# Patient Record
Sex: Female | Born: 1970 | Marital: Married | Smoking: Never smoker
Health system: Southern US, Community
[De-identification: ages and names within clinical notes are randomized; demographics above are authoritative.]

## PROBLEM LIST (undated history)

## (undated) DIAGNOSIS — F419 Anxiety disorder, unspecified: Secondary | ICD-10-CM

## (undated) HISTORY — PX: TOTAL ABDOMINAL HYSTERECTOMY: SHX209

## (undated) HISTORY — DX: Anxiety disorder, unspecified: F41.9

---

## 2003-09-26 HISTORY — PX: TUBAL LIGATION: SHX77

## 2011-01-03 ENCOUNTER — Ambulatory Visit (INDEPENDENT_AMBULATORY_CARE_PROVIDER_SITE_OTHER): Payer: Commercial Indemnity | Admitting: Family Medicine

## 2011-01-03 ENCOUNTER — Encounter: Payer: Self-pay | Admitting: Family Medicine

## 2011-01-03 DIAGNOSIS — Z13220 Encounter for screening for lipoid disorders: Secondary | ICD-10-CM

## 2011-01-03 DIAGNOSIS — F418 Other specified anxiety disorders: Secondary | ICD-10-CM | POA: Insufficient documentation

## 2011-01-03 DIAGNOSIS — N809 Endometriosis, unspecified: Secondary | ICD-10-CM | POA: Insufficient documentation

## 2011-01-03 DIAGNOSIS — F341 Dysthymic disorder: Secondary | ICD-10-CM

## 2011-01-03 LAB — CBC
HCT: 41.9 % (ref 36.0–46.0)
MCV: 88.2 fL (ref 78.0–100.0)
Platelets: 273 10*3/uL (ref 150–400)
RBC: 4.75 MIL/uL (ref 3.87–5.11)
WBC: 9.2 10*3/uL (ref 4.0–10.5)

## 2011-01-03 MED ORDER — LITHIUM CARBONATE 150 MG PO CAPS: ORAL_CAPSULE | ORAL | Status: DC

## 2011-01-03 NOTE — Progress Notes (Signed)
  Subjective:    Patient ID: Kristi Lawrence, female    DOB: Nov 13, 1970, 40 y.o.   MRN: [id number]  HPI Says has felt ancxious and depressed over the winter. Having a lot of marital problems. Started the depakote and the citalopram at the same time.  Recently treated with the yeast infection.  Only took the citalopram for 3 days and says she felt so depressed she couldn't get out of bed.  She used to take natural lithium that was helping  Husband says she has been really irritable.  She feels lonely.  Says she wants to continue her 5-HTP.  She fees frustrated. She is a Technical sales engineer.  Has  attempted suicide once. Says tried to swallow a whole handful of castor beans to try to hurt herself about a week ago. Seh says has suicidal thoughts but not actively suicidal right now. She says she is very is because her brother has tried several different times and has not done well with these.  She wouldl ike to try lithium.     Review of Systems  Constitutional: Negative for fever, diaphoresis and unexpected weight change.  HENT: Negative for hearing loss, sneezing, postnasal drip and tinnitus.   Eyes: Positive for visual disturbance.  Respiratory: Negative for cough and wheezing.   Cardiovascular: Positive for chest pain. Negative for palpitations.  Genitourinary: Negative for vaginal bleeding and vaginal discharge.  Musculoskeletal: Negative for arthralgias.  Neurological: Positive for headaches.  Hematological: Negative for adenopathy. Does not bruise/bleed easily.  Psychiatric/Behavioral: Positive for sleep disturbance. The patient is nervous/anxious.     BP 122/78  Pulse 82  Ht 5\' 6"  (1.676 m)  Wt 167 lb (75.751 kg)  BMI 26.95 kg/m2    Allergies  Allergen Reactions  . Benadryl Allergy     Makes heart race     History reviewed. No pertinent past medical history.  Past Surgical History  Procedure Date  . Tubal ligation 2005  . Cesarean section 1998    toxemia    History   Social  History  . Marital Status: Married    Spouse Name: MCKAILA DUFFUS    Number of Children: 1  . Years of Education: assoc degr   Occupational History  . janitor    Social History Main Topics  . Smoking status: Never Smoker   . Smokeless tobacco: Not on file  . Alcohol Use: No  . Drug Use: No  . Sexually Active: No   Other Topics Concern  . Not on file   Social History Narrative   No regular exercise.  No caffeinePoor calcium intake.     Family History  Problem Relation Age of Onset  . Cancer    . Heart attack    . Depression Brother   . Alzheimer's disease    . Schizophrenia Maternal Grandmother       Objective:   Physical Exam  Constitutional: She appears well-developed and well-nourished.  HENT:  Head: Normocephalic.  Eyes: Pupils are equal, round, and reactive to light.  Neck: No thyromegaly present.  Cardiovascular: Normal rate, regular rhythm and normal heart sounds.   Pulmonary/Chest: Effort normal and breath sounds normal.  Musculoskeletal: She exhibits no edema.  Lymphadenopathy:    She has no cervical adenopathy.  Skin: Skin is warm and dry.  Psychiatric: She has a normal mood and affect. Her behavior is normal. Thought content normal.          Assessment & Plan:

## 2011-01-03 NOTE — Patient Instructions (Signed)
We will call you with the referral for counseling.  Follow up in about 1-2 weeks for mood.  Start the lithium as below.

## 2011-01-03 NOTE — Assessment & Plan Note (Signed)
She is not actively suicidal today and asked her to go to the ED if has suicidal thoughts again Will start lithium. F/U in 10 days.  I really do think she would benefit from counseling. She says she is doing this because her husband wants her to get counseling time 100% sure how motivated she is to really being open to this but I certainly think it would be extremely helpful. she says she will at least try it. Refer to behavioral health downstairs. Also check her TSH. She says she has not had labs in years. Certainly she could have a secondary source for her recent change in mood.

## 2011-01-04 ENCOUNTER — Telehealth: Payer: Self-pay | Admitting: Family Medicine

## 2011-01-04 LAB — LIPID PANEL
HDL: 40 mg/dL (ref >39)
LDL Cholesterol: 120 mg/dL — ABNORMAL HIGH (ref 0–99)
Total CHOL/HDL Ratio: 4.9 Ratio

## 2011-01-04 LAB — TSH: TSH: 2.664 u[IU]/mL (ref 0.350–4.500)

## 2011-01-04 LAB — COMPLETE METABOLIC PANEL WITH GFR
AST: 17 U/L (ref 0–37)
Alkaline Phosphatase: 76 U/L (ref 39–117)
BUN: 14 mg/dL (ref 6–23)
Creat: 0.72 mg/dL (ref 0.40–1.20)

## 2011-01-04 NOTE — Telephone Encounter (Signed)
Call pt: Labs are all normal except cholesterol LDL is 120 and the TG are high. Work on low fat diet and regular exercise to improve this. Recheck chol in one year.

## 2011-01-04 NOTE — Telephone Encounter (Signed)
Pt.notified

## 2011-01-10 ENCOUNTER — Other Ambulatory Visit: Payer: Self-pay | Admitting: Family Medicine

## 2011-01-10 ENCOUNTER — Ambulatory Visit (INDEPENDENT_AMBULATORY_CARE_PROVIDER_SITE_OTHER): Payer: Commercial Indemnity | Admitting: Family Medicine

## 2011-01-10 ENCOUNTER — Telehealth: Payer: Self-pay | Admitting: Family Medicine

## 2011-01-10 VITALS — BP 136/76 | HR 76 | Wt 168.0 lb

## 2011-01-10 DIAGNOSIS — F418 Other specified anxiety disorders: Secondary | ICD-10-CM

## 2011-01-10 DIAGNOSIS — N898 Other specified noninflammatory disorders of vagina: Secondary | ICD-10-CM

## 2011-01-10 DIAGNOSIS — F341 Dysthymic disorder: Secondary | ICD-10-CM

## 2011-01-10 DIAGNOSIS — L293 Anogenital pruritus, unspecified: Secondary | ICD-10-CM

## 2011-01-10 DIAGNOSIS — R351 Nocturia: Secondary | ICD-10-CM

## 2011-01-10 LAB — POCT URINALYSIS DIPSTICK
Nitrite, UA: NEGATIVE
Spec Grav, UA: 1.025
pH, UA: 5

## 2011-01-10 MED ORDER — METRONIDAZOLE 500 MG PO TABS: 500.0000 mg | ORAL_TABLET | Freq: Two times a day (BID) | ORAL | Status: AC

## 2011-01-10 MED ORDER — CIPROFLOXACIN HCL 500 MG PO TABS: 500.0000 mg | ORAL_TABLET | Freq: Two times a day (BID) | ORAL | Status: AC

## 2011-01-10 NOTE — Telephone Encounter (Signed)
Call pt: Wet prep is + for clue cells. Will send over ABX.

## 2011-01-10 NOTE — Assessment & Plan Note (Signed)
Overall she is actually doing much better on the lithium and she is tolerating it well without any side effects. She just went back to the twice a day dose approximately 3 days ago. I will see her back in one month at that point we can check a lithium level was well.  Her pH to 9 score was 19 today and she rates it as very difficult. On her mood questionnaire she screened positive for bipolar disorder

## 2011-01-10 NOTE — Progress Notes (Signed)
  Subjective:    Patient ID: Kristi Lawrence, female    DOB: 1970-12-01, 40 y.o.   MRN: [id number]  HPI Says she is less irritable and she has been crying less. Tolerating the lithium well.  No SE. Says her husband has been up and down in her moods. Says did get drunk a few nights ago because she was frustrated.  No change in bowels like she had with the other medication.  Not sleeping well.  Says has difficulty sleeping.  Got to bed as early as 9:30 and wakes up multiple times to urinate.  Has been this way for a long time.  Just went up to bid on the lithium 3 days ago.    She has been spotting and would like [place] to do a wet prep. Still having vaginal ithcing.    Review of Systems     Objective:   Physical Exam  Constitutional: She appears well-developed and well-nourished.  Cardiovascular: Normal rate, regular rhythm and normal heart sounds.   Pulmonary/Chest: Effort normal and breath sounds normal.          Assessment & Plan:  Vaginal itching -  Will send a wet prep. Also UA performed. UA with trace blood. Since also having nocturia will tx with cipro and will send a culture.    Nocturia-UA with trace blood. Since also having nocturia will tx with cipro and will send a culture.   This is clearly disrupting her quality of sleep. Regimens to improve her ability to fall asleep his first name is slightly really need to figure out why she is having such frequent urinations at night. She personally feels like it may be mental.

## 2011-01-11 NOTE — Telephone Encounter (Signed)
Pt.notified

## 2011-01-12 ENCOUNTER — Telehealth: Payer: Self-pay | Admitting: Family Medicine

## 2011-01-12 LAB — URINE CULTURE

## 2011-01-12 NOTE — Telephone Encounter (Signed)
Pt notified. Pt states she doesn't know how she is feeling

## 2011-01-12 NOTE — Telephone Encounter (Signed)
Urine culture is neg.  Is she feeling any better?

## 2011-01-17 ENCOUNTER — Other Ambulatory Visit: Payer: Self-pay | Admitting: Family Medicine

## 2011-01-17 ENCOUNTER — Ambulatory Visit (INDEPENDENT_AMBULATORY_CARE_PROVIDER_SITE_OTHER): Payer: Commercial Indemnity | Admitting: Behavioral Health

## 2011-01-17 DIAGNOSIS — F331 Major depressive disorder, recurrent, moderate: Secondary | ICD-10-CM

## 2011-01-26 ENCOUNTER — Encounter (INDEPENDENT_AMBULATORY_CARE_PROVIDER_SITE_OTHER): Payer: Commercial Indemnity | Admitting: Behavioral Health

## 2011-01-26 DIAGNOSIS — F331 Major depressive disorder, recurrent, moderate: Secondary | ICD-10-CM

## 2011-02-02 ENCOUNTER — Encounter (INDEPENDENT_AMBULATORY_CARE_PROVIDER_SITE_OTHER): Payer: Commercial Indemnity | Admitting: Behavioral Health

## 2011-02-02 DIAGNOSIS — F331 Major depressive disorder, recurrent, moderate: Secondary | ICD-10-CM

## 2011-02-07 ENCOUNTER — Ambulatory Visit (INDEPENDENT_AMBULATORY_CARE_PROVIDER_SITE_OTHER): Payer: Commercial Indemnity | Admitting: Family Medicine

## 2011-02-07 VITALS — BP 127/87 | HR 82 | Ht 66.0 in | Wt 170.0 lb

## 2011-02-07 DIAGNOSIS — N809 Endometriosis, unspecified: Secondary | ICD-10-CM

## 2011-02-07 DIAGNOSIS — F341 Dysthymic disorder: Secondary | ICD-10-CM

## 2011-02-07 DIAGNOSIS — F418 Other specified anxiety disorders: Secondary | ICD-10-CM

## 2011-02-07 DIAGNOSIS — Z5181 Encounter for therapeutic drug level monitoring: Secondary | ICD-10-CM

## 2011-02-07 MED ORDER — CELECOXIB 200 MG PO CAPS: 200.0000 mg | ORAL_CAPSULE | Freq: Two times a day (BID) | ORAL | Status: DC

## 2011-02-07 NOTE — Patient Instructions (Signed)
Can try the medication for your endometriosis.

## 2011-02-07 NOTE — Assessment & Plan Note (Signed)
I explained her that we do not usually use narcotics to treat this. I would prefer that she see GYN to discuss further treatment. In the meantime we can certainly try a different anti-inflammatory. I sent a prescription for Celebrex to the pharmacy for her to try. I did remind her that the 10 minute always clearly  hazardous to her health.

## 2011-02-07 NOTE — Assessment & Plan Note (Signed)
PHQ - 9 score of 12 today, GAD- 7 score of 8.   medication as well as counseling has been helping her tremendously. I am happy with the progress that she has made. I discussed whether or not she thought she might feel better on an increased dose of lithium but she said she would like to stay at her current dose. She is due to check her lithium level today so we will do that. Followup in 6 weeks.

## 2011-02-07 NOTE — Progress Notes (Signed)
  Subjective:    Patient ID: Kristi Lawrence, female    DOB: Mar 03, 1971, 40 y.o.   MRN: [id number]  HPI She feels the lithium is helping her overall. She really doesn't want to change her dose. Still really struggling with he relationship with her husband. She feels she is not supportive. She is seeing Mr Michelle Piper downstairs for counseling and feels this is helpful.  She feels her husband is her  Trigger. Seh denies any SE from the lithium. Says actually had her best day this week that she had had in a year, but says her husband came home and was mean to her and ruined it.    Wants to know what she can do for her endometriosis. She has to take about 64 midols yo control her pain and then still usually can't leave the house the first day for her period. Marland Kitchen  She was on Lupron at one point. Had tubal so whe wouldn't have to take a. OCPs so really doesn't want to take these.     Review of Systems     Objective:   Physical Exam  Constitutional: She appears well-developed and well-nourished.  Cardiovascular: Normal rate, regular rhythm and normal heart sounds.   Pulmonary/Chest: Effort normal and breath sounds normal.          Assessment & Plan:

## 2011-02-08 ENCOUNTER — Telehealth: Payer: Self-pay | Admitting: Family Medicine

## 2011-02-08 NOTE — Telephone Encounter (Signed)
Consultation: Her lithium level is normal. She is happy with her current dose we can leave it but we do not plan for him to increase her dose if she feels like he would be helpful. Otherwise please keep scheduled followup.

## 2011-02-08 NOTE — Telephone Encounter (Signed)
Pt notified of results and will leave dose the same

## 2011-02-16 ENCOUNTER — Encounter (INDEPENDENT_AMBULATORY_CARE_PROVIDER_SITE_OTHER): Payer: Commercial Indemnity | Admitting: Behavioral Health

## 2011-02-16 DIAGNOSIS — F332 Major depressive disorder, recurrent severe without psychotic features: Secondary | ICD-10-CM

## 2011-02-21 ENCOUNTER — Other Ambulatory Visit: Payer: Self-pay | Admitting: Obstetrics & Gynecology

## 2011-02-21 ENCOUNTER — Encounter (INDEPENDENT_AMBULATORY_CARE_PROVIDER_SITE_OTHER): Payer: Commercial Indemnity | Admitting: Obstetrics & Gynecology

## 2011-02-21 DIAGNOSIS — R5383 Other fatigue: Secondary | ICD-10-CM

## 2011-02-21 DIAGNOSIS — R102 Pelvic and perineal pain: Secondary | ICD-10-CM

## 2011-02-21 DIAGNOSIS — N949 Unspecified condition associated with female genital organs and menstrual cycle: Secondary | ICD-10-CM

## 2011-02-22 NOTE — Assessment & Plan Note (Signed)
Kristi Lawrence, Kristi Lawrence                 ACCOUNT NO.:  [id number]  MEDICAL RECORD NO.:  [id number]           PATIENT TYPE:  A  LOCATION:  [facility] [place]          FACILITY:  [facility]  PHYSICIAN:  [name], MD      DATE OF BIRTH:  11-08-1970  DATE OF SERVICE:  02/21/2011                                 CLINIC NOTE  The patient is a 40 year old G2, P1-0-1-1 female who presents for pelvic pain.  The patient has a long history of pelvic pain.  She had a C- section for severe toxemia, but no endometriosis was noted at that time. In 2004, she underwent an elective BTL due to the severe toxemia and "her doctor," Dr. Marja Kays from [facility], told her had endometriosis. The patient was not having pelvic pain at that time, but he placed her on 9 months of Lupron to control the proliferation of the endometriosis. The patient started having a lot of side effects of the Lupron, so she stopped.  After that, she states she started having pelvic pain.  The patient went to Dr. Jackquline Bosch approximately a year later, who diagnosed her with low progesterone and placed her on progesterone cream.  She then lost her insurance, I believe, and started using whole foods, naturopathic remedies.  She does take a lot of herbs in general.  The patient states the pain has been bad for 5 years.  She does not have sex due to marital problems, but when she did, it was only occasional, pain with sex.  She is undergoing counseling with Behavioral Health here. The patient states that the husband wanted more children and was upset that she got her tubes tied.  She does have irritable bowel syndrome and has lot of problems with constipation and she does not stay on her "herb."  She does have frequency of urination and urgency, we did do a urinalysis and urine culture.  She drinks at least 64 ounces of water a day which could do with the frequency.  A voiding diary might also be of use.  The patient is also complaining of  fatigue and weight gain and we will draw a TSH today.  The patient has never had transvaginal ultrasound that I can tell, we will order that today as well.  PAST MEDICAL HISTORY: 1. Arthritis. 2. Irritable bowel syndrome.  OBSTETRICAL HISTORY:  C-section x1 due to severe toxemia.  She has a vertical skin incision.  She also had a termination.  MENSTRUAL HISTORY:  The patient had menarche at age 41.  Her periods are regular, they last 6-7 days, are heavy with severe pain.  She has never been diagnosed with fibroid, tumors of the uterus, ovarian cysts, or sexually transmitted diseases.  She has been diagnosed with endometriosis.  PAST SURGICAL HISTORY:  Emergency C-section due to severe toxemia and then a tubal ligation and termination of pregnancy.  SOCIAL HISTORY:  She is employed.  She lives with her husband and daughter.  She does not smoke, drink, or do drugs.  She has never been sexually or physically abused.  FAMILY HISTORY:  Positive for either ovarian or endometrial cancer on her father's side, one in an aunt and  one in the grandmother, she does not know where it originated, but they know that it spread everywhere in her pelvis and this is what they got from.  We talked about BRCA testing and possibly Lynch testing.  She does not seem that she will be able to get information about these tests.  Systems review is positive for depression, anxiety, headaches, lightheadedness, muscle and joint pain, nausea, abdominal pain, and vaginal itching.  She recently was treated for bacterial vaginosis by Dr. Linford Arnold.  PHYSICAL EXAMINATION:  VITAL SIGNS:  Pulse 76, blood pressure 134/84, weight 179, height 65 inches. GENERAL:  Well nourished, well developed, no apparent distress. HEENT:  Normocephalic, atraumatic. CHEST:  Normal movement. ABDOMEN:  Soft, nontender.  No rebound, no guarding, no organomegaly, and no hernia.  No pain elicited on exam. PELVIC:  Genitalia Tanner V.   Vagina pink, normal rugae.  Cervix closed. Uterus is nontender.  Adnexa right and left, both ovaries are felt, no tenderness on either side.  She is mildly tender over her bladder, no tenderness over the rectum.  She has the most pain which I will consider moderate over her ischial spines and levator ani.  ASSESSMENT AND PLAN:  A 40 year old female with pelvic pain, lethargy, weight gain. 1. TSH. 2. The patient needs annual examination. 3. Transvaginal ultrasound. 4. Record release from Dr. Marja Kays and Berton Lan.          ______________________________ [name], MD    KL/MEDQ  D:  02/21/2011  T:  02/22/2011  Job:  [id number]

## 2011-02-23 ENCOUNTER — Ambulatory Visit
Admission: RE | Admit: 2011-02-23 | Discharge: 2011-02-23 | Disposition: A | Discharge status: Home / Self Care | Payer: Commercial Indemnity | Source: Ambulatory Visit | Attending: Obstetrics & Gynecology | Admitting: Obstetrics & Gynecology

## 2011-02-23 ENCOUNTER — Encounter (INDEPENDENT_AMBULATORY_CARE_PROVIDER_SITE_OTHER): Payer: Commercial Indemnity | Admitting: Behavioral Health

## 2011-02-23 ENCOUNTER — Encounter (HOSPITAL_COMMUNITY): Payer: Commercial Indemnity | Admitting: Behavioral Health

## 2011-02-23 DIAGNOSIS — F332 Major depressive disorder, recurrent severe without psychotic features: Secondary | ICD-10-CM

## 2011-02-23 DIAGNOSIS — R102 Pelvic and perineal pain: Secondary | ICD-10-CM

## 2011-03-02 ENCOUNTER — Encounter (INDEPENDENT_AMBULATORY_CARE_PROVIDER_SITE_OTHER): Payer: Commercial Indemnity | Admitting: Behavioral Health

## 2011-03-02 DIAGNOSIS — F332 Major depressive disorder, recurrent severe without psychotic features: Secondary | ICD-10-CM

## 2011-03-06 ENCOUNTER — Encounter (INDEPENDENT_AMBULATORY_CARE_PROVIDER_SITE_OTHER): Payer: Commercial Indemnity | Admitting: Behavioral Health

## 2011-03-06 DIAGNOSIS — F332 Major depressive disorder, recurrent severe without psychotic features: Secondary | ICD-10-CM

## 2011-03-14 ENCOUNTER — Ambulatory Visit: Payer: Commercial Indemnity | Admitting: Obstetrics & Gynecology

## 2011-03-14 ENCOUNTER — Other Ambulatory Visit: Payer: Self-pay | Admitting: Family Medicine

## 2011-03-14 ENCOUNTER — Ambulatory Visit (INDEPENDENT_AMBULATORY_CARE_PROVIDER_SITE_OTHER): Payer: Commercial Indemnity | Admitting: Obstetrics & Gynecology

## 2011-03-14 DIAGNOSIS — F32A Depression, unspecified: Secondary | ICD-10-CM

## 2011-03-14 DIAGNOSIS — N949 Unspecified condition associated with female genital organs and menstrual cycle: Secondary | ICD-10-CM

## 2011-03-14 DIAGNOSIS — R319 Hematuria, unspecified: Secondary | ICD-10-CM

## 2011-03-14 DIAGNOSIS — F329 Major depressive disorder, single episode, unspecified: Secondary | ICD-10-CM

## 2011-03-14 NOTE — Telephone Encounter (Signed)
Pt stopped by the office for med refill of lithium.  Due for 6 week followup .  Will give a 30 day supply of medication. Fay Records, LPN

## 2011-03-15 NOTE — Assessment & Plan Note (Signed)
Kristi Lawrence, HEACOX                 ACCOUNT NO.:  [id number]  MEDICAL RECORD NO.:  [id number]           PATIENT TYPE:  LOCATION:  [facility] [place]           FACILITY:  PHYSICIAN:  [name], MD      DATE OF BIRTH:  [birth year]  DATE OF SERVICE:                                 CLINIC NOTE  The patient is a 40 year old female presents for chronic pelvic pain. The patient had transvaginal ultrasound which showed normal uterus. Bilateral ovaries were normal with small physiological follicles present.  The patient had an urinalysis done which showed moderate blood.  She has been told she has had hematuria in the past.  I pulled an urinalysis from April 2012 also showed small blood.  The patient denies any urinary symptoms that are different for her, however she does have a lot of urinary frequency.  She goes about every hour.  The patient does not admit to losing urine, but she does have urgency.  I believe now that she has had hematuria on 2 specimens.  I think she needs an evaluation by urologist to see there is anything pathological causing the hematuria and also evaluation for interstitial cystitis because she does have pain above the bladder and urinary frequency, urgency.  The patient do have an operative note from 2002 that showed laser fulguration of endometriosis, however there was no pathological specimens.  There was some mild lysis of adhesions evolving the sigmoid colon and there was a large amount of adhesions involving the bladder and lower uterine segment, but these were not taken down during the operation from 2002.  We talked about natural remedies versus invasive remedies.  The patient has not been a fan of not going to doctors because she is convinced that after her laparoscopy and being placed on Lupron for 9 months, she thinks this caused her pelvic pain because she did not have pelvic pain before.  I did find in her operative note that she did have mild pelvic  pain prior to the operation and she does not remember telling the doctor this, but she said it is nothing like she has now.  We talked about physical therapy because she is tender over her levators and vaginal sidewalls which could be due to chronic muscle contraction and causing the pain cycle.  We also talked about an urological evaluation which she is also in favor off.  The other option would be Lupron for 3 months to see if this helps her pelvic pain.  If it did indeed help her pelvic pain, then proceeding with hysterectomy would be warranted.  However, the patient wants to hold off on this for right now.  She also does not want hysterectomy and then her ovaries taking out because this would obliviously put her in a medical menopause and this is not what she desires for herself.  We also talked about acupuncture and I am going to try to find out acupuncture clinician who focuses on womens health in the area.  The patient continues to take multiple herbs to help her with her bowel movements.  She says she has no problems with constipation as long as she is on herbs.  PAST  MEDICAL HISTORY:  Arthritis, irritable bowel syndrome.  OBSTETRICAL HISTORY:  C-section x1 one with a large vertical skin incision.  She had 1 termination.  GYNECOLOGIC HISTORY:  Operative note showing endometriosis but no pathology.  She has had tubal ligation.  She is occasionally sexually active.  However she has some relationship problems with her husband.  SOCIAL HISTORY:  No changes.  FAMILY HISTORY:  No changes.  MEDICATIONS:  No changes from 2 weeks ago.  PHYSICAL EXAMINATION:  VITAL SIGNS:  Pulse 87, blood pressure 136/80, weight 172, height 65 inches. GENERAL:  Well nourished, well developed, no apparent distress. HEENT:  Normocephalic, atraumatic. CHEST:  Normal movement. ABDOMEN:  Soft, nontender.  No rebound or guarding.  No organomegaly. No hernia. PELVIC:  Genitalia, Tanner V.  Vagina pink.   Normal rugae.  The patient is still tender over the side walls of the vagina, mild to moderate tenderness.  Uterus is now mildly tender.  Adnexa, no tenderness, no masses.  ASSESSMENT AND PLAN:  A 40 year old female with chronic pelvic pain. 1. Pelvic physical therapy. 2. Chronic hematuria.  Refer to urologist for evaluation of the     chronic hematuria as well as interstitial cystitis. 3. We will find acupuncturist in the area if physical therapy does not     work. 4. Motrin 600 mg every 6 hours or 800 mg every 8 hours at the first     onset of her menses.  This will help with pain and decreasing     menstrual flow. 5. The patient would also consider possible Lupron for 3 months if all     this fails, just see if this helps her pelvic pain. 6. Return to clinic in 2 months          ______________________________ [name], MD    KL/MEDQ  D:  03/14/2011  T:  03/15/2011  Job:  [id number]

## 2011-03-20 ENCOUNTER — Encounter (INDEPENDENT_AMBULATORY_CARE_PROVIDER_SITE_OTHER): Payer: Commercial Indemnity | Admitting: Behavioral Health

## 2011-03-20 DIAGNOSIS — F332 Major depressive disorder, recurrent severe without psychotic features: Secondary | ICD-10-CM

## 2011-03-27 ENCOUNTER — Ambulatory Visit: Payer: Commercial Indemnity | Attending: Obstetrics & Gynecology | Admitting: Physical Therapy

## 2011-03-27 DIAGNOSIS — M2420 Disorder of ligament, unspecified site: Secondary | ICD-10-CM | POA: Insufficient documentation

## 2011-03-27 DIAGNOSIS — IMO0001 Reserved for inherently not codable concepts without codable children: Secondary | ICD-10-CM | POA: Insufficient documentation

## 2011-03-27 DIAGNOSIS — M629 Disorder of muscle, unspecified: Secondary | ICD-10-CM | POA: Insufficient documentation

## 2011-03-28 ENCOUNTER — Encounter: Payer: Self-pay | Admitting: Family Medicine

## 2011-03-28 ENCOUNTER — Ambulatory Visit (INDEPENDENT_AMBULATORY_CARE_PROVIDER_SITE_OTHER): Payer: Commercial Indemnity | Admitting: Family Medicine

## 2011-03-28 VITALS — BP 131/91 | HR 78 | Ht 65.0 in | Wt 171.0 lb

## 2011-03-28 DIAGNOSIS — R102 Pelvic and perineal pain: Secondary | ICD-10-CM

## 2011-03-28 DIAGNOSIS — F341 Dysthymic disorder: Secondary | ICD-10-CM

## 2011-03-28 DIAGNOSIS — G4733 Obstructive sleep apnea (adult) (pediatric): Secondary | ICD-10-CM

## 2011-03-28 DIAGNOSIS — N949 Unspecified condition associated with female genital organs and menstrual cycle: Secondary | ICD-10-CM

## 2011-03-28 DIAGNOSIS — F418 Other specified anxiety disorders: Secondary | ICD-10-CM

## 2011-03-28 MED ORDER — NAPROXEN 375 MG PO TABS: 375.0000 mg | ORAL_TABLET | Freq: Two times a day (BID) | ORAL | Status: DC

## 2011-03-28 NOTE — Assessment & Plan Note (Addendum)
PHQ-9 socre of 11.  GAD-7 score of 8.  She is stable from last time. I really think she might benefit from increase on her dose but she wants to stay on the same dose.  I do want her to continue counseling as I do think it really helps her.

## 2011-03-28 NOTE — Assessment & Plan Note (Signed)
CPAP - Discussed seeing Dr. Gaetano Net for her sleep issues. I really want her to wear it but I really think she could benefit from a speacialist to help her be able to use her CPAP. Pt says want to wait on this for now as she is seeing so many specialists for other issues.

## 2011-03-28 NOTE — Progress Notes (Signed)
  Subjective:    Patient ID: Kristi Lawrence, female    DOB: 10-17-1970, 40 y.o.   MRN: [id number]  HPI She is doing some cousneling.  She is doing well overall. She feel really tired today.  She is taking her lihtium.  Does snore.  She does have sleep apnea.  Quit using her CPAP because has a a lot of difficulty  Wearing it because it ramps up.    She is really struggling with her relationship. She wants to continue her current dose on the Lithium.  Her Collene Gobble is he rbiggest trigger but she feels stuck int he relationbship because financially she can afford to leave him. She also has a child involved.    Tried the celebrex for her abdominal pain and does work but doesn't like how she feels.  She felt the celebrex caused her to feel tearful and more depressed.    Review of Systems     Objective:   Physical Exam  Constitutional: She is oriented to person, place, and time. She appears well-developed and well-nourished.  HENT:  Head: Normocephalic and atraumatic.  Cardiovascular: Normal rate, regular rhythm and normal heart sounds.   Pulmonary/Chest: Effort normal and breath sounds normal.  Neurological: She is alert and oriented to person, place, and time.  Skin: Skin is warm and dry.  Psychiatric: She has a normal mood and affect. Her behavior is normal.          Assessment & Plan:  CPAP - Discussed seeing Dr. Gaetano Net for her sleep issues. I really want her to wear it but I really think she could benefit from a speacialist to help her be able to use her CPAP. Pt says want to wait on this for now as she is seeing so many specialists for other issues.    Pelvic Pain - She is now seeing GYN. Wil change her celebrex to naproxen and see if doesn't seem to worsen her depression. This is a very unusual sxs of celebrex and I am not convined these are related.

## 2011-04-03 ENCOUNTER — Encounter (INDEPENDENT_AMBULATORY_CARE_PROVIDER_SITE_OTHER): Payer: Commercial Indemnity | Admitting: Behavioral Health

## 2011-04-03 DIAGNOSIS — F331 Major depressive disorder, recurrent, moderate: Secondary | ICD-10-CM

## 2011-04-05 ENCOUNTER — Encounter (HOSPITAL_COMMUNITY): Payer: Commercial Indemnity | Admitting: Behavioral Health

## 2011-04-05 ENCOUNTER — Ambulatory Visit: Payer: Commercial Indemnity | Admitting: Physical Therapy

## 2011-04-12 ENCOUNTER — Ambulatory Visit: Payer: Commercial Indemnity | Admitting: Physical Therapy

## 2011-04-17 ENCOUNTER — Encounter (HOSPITAL_COMMUNITY): Payer: Commercial Indemnity | Admitting: Behavioral Health

## 2011-04-18 ENCOUNTER — Encounter (INDEPENDENT_AMBULATORY_CARE_PROVIDER_SITE_OTHER): Payer: Commercial Indemnity | Admitting: Behavioral Health

## 2011-04-18 DIAGNOSIS — F331 Major depressive disorder, recurrent, moderate: Secondary | ICD-10-CM

## 2011-04-19 ENCOUNTER — Ambulatory Visit: Payer: Commercial Indemnity | Admitting: Physical Therapy

## 2011-04-26 ENCOUNTER — Encounter: Payer: Commercial Indemnity | Admitting: Physical Therapy

## 2011-05-02 ENCOUNTER — Encounter (INDEPENDENT_AMBULATORY_CARE_PROVIDER_SITE_OTHER): Payer: Commercial Indemnity | Admitting: Behavioral Health

## 2011-05-02 DIAGNOSIS — F331 Major depressive disorder, recurrent, moderate: Secondary | ICD-10-CM

## 2011-05-03 ENCOUNTER — Ambulatory Visit: Payer: Commercial Indemnity | Admitting: Physical Therapy

## 2011-05-12 ENCOUNTER — Encounter (INDEPENDENT_AMBULATORY_CARE_PROVIDER_SITE_OTHER): Payer: Commercial Indemnity | Admitting: Behavioral Health

## 2011-05-12 DIAGNOSIS — F332 Major depressive disorder, recurrent severe without psychotic features: Secondary | ICD-10-CM

## 2011-05-18 ENCOUNTER — Encounter (INDEPENDENT_AMBULATORY_CARE_PROVIDER_SITE_OTHER): Payer: Commercial Indemnity | Admitting: Behavioral Health

## 2011-05-18 DIAGNOSIS — F332 Major depressive disorder, recurrent severe without psychotic features: Secondary | ICD-10-CM

## 2011-05-25 ENCOUNTER — Encounter (HOSPITAL_COMMUNITY): Payer: Commercial Indemnity | Admitting: Behavioral Health

## 2011-05-25 ENCOUNTER — Encounter (INDEPENDENT_AMBULATORY_CARE_PROVIDER_SITE_OTHER): Payer: Commercial Indemnity | Admitting: Behavioral Health

## 2011-05-25 DIAGNOSIS — F332 Major depressive disorder, recurrent severe without psychotic features: Secondary | ICD-10-CM

## 2011-06-01 ENCOUNTER — Encounter (INDEPENDENT_AMBULATORY_CARE_PROVIDER_SITE_OTHER): Payer: Commercial Indemnity | Admitting: Behavioral Health

## 2011-06-01 DIAGNOSIS — F332 Major depressive disorder, recurrent severe without psychotic features: Secondary | ICD-10-CM

## 2011-06-08 ENCOUNTER — Encounter (INDEPENDENT_AMBULATORY_CARE_PROVIDER_SITE_OTHER): Payer: Commercial Indemnity | Admitting: Behavioral Health

## 2011-06-08 DIAGNOSIS — F332 Major depressive disorder, recurrent severe without psychotic features: Secondary | ICD-10-CM

## 2011-06-15 ENCOUNTER — Encounter (HOSPITAL_COMMUNITY): Payer: Commercial Indemnity | Admitting: Behavioral Health

## 2011-06-16 ENCOUNTER — Encounter (INDEPENDENT_AMBULATORY_CARE_PROVIDER_SITE_OTHER): Payer: Commercial Indemnity | Admitting: Behavioral Health

## 2011-06-16 DIAGNOSIS — F332 Major depressive disorder, recurrent severe without psychotic features: Secondary | ICD-10-CM

## 2011-06-22 ENCOUNTER — Encounter (INDEPENDENT_AMBULATORY_CARE_PROVIDER_SITE_OTHER): Payer: 59 | Admitting: Behavioral Health

## 2011-06-22 DIAGNOSIS — F332 Major depressive disorder, recurrent severe without psychotic features: Secondary | ICD-10-CM

## 2011-06-28 ENCOUNTER — Ambulatory Visit: Payer: Commercial Indemnity | Admitting: Family Medicine

## 2011-06-29 ENCOUNTER — Encounter (INDEPENDENT_AMBULATORY_CARE_PROVIDER_SITE_OTHER): Payer: 59 | Admitting: Behavioral Health

## 2011-06-29 DIAGNOSIS — F332 Major depressive disorder, recurrent severe without psychotic features: Secondary | ICD-10-CM

## 2011-07-06 ENCOUNTER — Encounter (HOSPITAL_COMMUNITY): Payer: 59 | Admitting: Behavioral Health

## 2011-07-11 ENCOUNTER — Encounter (INDEPENDENT_AMBULATORY_CARE_PROVIDER_SITE_OTHER): Payer: 59 | Admitting: Behavioral Health

## 2011-07-11 DIAGNOSIS — F332 Major depressive disorder, recurrent severe without psychotic features: Secondary | ICD-10-CM

## 2011-07-19 ENCOUNTER — Encounter (INDEPENDENT_AMBULATORY_CARE_PROVIDER_SITE_OTHER): Payer: 59 | Admitting: Behavioral Health

## 2011-07-19 DIAGNOSIS — F332 Major depressive disorder, recurrent severe without psychotic features: Secondary | ICD-10-CM

## 2011-07-21 ENCOUNTER — Encounter (HOSPITAL_COMMUNITY): Payer: 59 | Admitting: Behavioral Health

## 2011-07-28 ENCOUNTER — Encounter (INDEPENDENT_AMBULATORY_CARE_PROVIDER_SITE_OTHER): Payer: 59 | Admitting: Behavioral Health

## 2011-07-28 DIAGNOSIS — F331 Major depressive disorder, recurrent, moderate: Secondary | ICD-10-CM

## 2011-08-01 ENCOUNTER — Telehealth (HOSPITAL_COMMUNITY): Payer: Self-pay

## 2011-08-01 NOTE — Telephone Encounter (Signed)
The client was returning my call in regards to how much of her chart she wanted released to her attorney and what she would like the letter to her attorney to say.

## 2011-08-11 ENCOUNTER — Ambulatory Visit (INDEPENDENT_AMBULATORY_CARE_PROVIDER_SITE_OTHER): Payer: 59 | Admitting: Behavioral Health

## 2011-08-11 ENCOUNTER — Encounter (HOSPITAL_COMMUNITY): Payer: Self-pay | Admitting: Behavioral Health

## 2011-08-11 DIAGNOSIS — F331 Major depressive disorder, recurrent, moderate: Secondary | ICD-10-CM

## 2011-08-11 NOTE — Progress Notes (Signed)
THERAPIST PROGRESS NOTE  Session Time: 8:00  Participation Level: Active  Behavioral Response: Fairly GroomedAlertAnxious  Type of Therapy: Individual Therapy  Treatment Goals addressed: Anxiety  Interventions: CBT  Summary: MERIAM CHOJNOWSKI is a 40 y.o. female who presents with Major depressive disorder.   Suicidal/Homicidal: Nowithout intent/plan  Therapist Response: The client indicated that she is drinking some anxiety in relation to the upcoming mediation hearing which is scheduled for November 26. He indicated that her attorney will not be present but that they will watch a film and in a mediator will work with she and her husband for custody of the 29 year old daughter. It appears that most of the clients anxiety relates to her not knowing what to expect from the hearing. She indicated that she did taxed her attorney this morning and will speak with her prior to the hearing on the 26th as to what to expect so she can better prepare herself. We spent significant time reviewing coping skills which will help in anxiety reduction. We also talked about mood regulation as she has not been around her husband for the past 2 months. I advised her not to use any profanity and not to become angry she did acknowledge that would be difficult not to said she still feels some frustration with her husband and the way he is reacting to their separation and not allowing her to see her daughter. She did feel that she can keep her anger in check for the length of the mediation meeting. The client reports that she has not seen her daughter since the last time we spoke. She did allow me to read and e-mail that her husband sent to her attorney in which the husband indicated that it is the daughter's choice to not see the client. The client is not convinced that the daughter does not want to see her but does feel that the husband is influencing the daughter in a negative way toward the client. The client otherwise  is making strides in other areas of her life. He continues indicate that she is taking better care of herself physically and is starting to do things such as buy some limited new close which make her for better about herself as well as wearing makeup. She indicated that she will be spending the Thanksgiving holiday with her mother and father and will also see her sister during that time. She does regret that she cannot see her daughter over Thanksgiving because she would like to take her daughter to her family's home which he cannot do yet. The client indicated that she knows that the hearing for custody to become ugly as well as further proceedings so we prepared for what her husband may say and how she may respond to what he says. Again we spent significant time talking about mood elation and using coping skills to help her through the custody of mediation in any subsequent hearings related to that.  The client also described a dream in which a blue heroin was chasing a dock and had another throat. She indicated that eventually the blue heroin let go the doctor did not fill, got up and began chasing blue [facility] client was questioning where she finished the veteran had a how it may have been affecting her. We talked about how she has expectations of how things should be and had expectations of our marriage should have been we also talked about in relation to the dream how were things that she could and  could not control, and briefly about fight versus flight in response to crisis situations. I did not tell the client whether I thought she was the blue [place] or the duck.  Plan: Return again in 2weeks.  Diagnosis: Axis I: Major Depression, Recurrent severe    Axis II: Deferred    Jawann Urbani M, [facility] 08/11/2011

## 2011-08-23 ENCOUNTER — Encounter (HOSPITAL_COMMUNITY): Payer: Self-pay | Admitting: Behavioral Health

## 2011-08-23 ENCOUNTER — Ambulatory Visit (INDEPENDENT_AMBULATORY_CARE_PROVIDER_SITE_OTHER): Payer: 59 | Admitting: Behavioral Health

## 2011-08-23 DIAGNOSIS — F411 Generalized anxiety disorder: Secondary | ICD-10-CM

## 2011-08-23 DIAGNOSIS — F325 Major depressive disorder, single episode, in full remission: Secondary | ICD-10-CM

## 2011-08-23 NOTE — Progress Notes (Signed)
   THERAPIST PROGRESS NOTE  Session Time: 1:00  Participation Level: Active  Behavioral Response: Well GroomedAlertAnxious  Type of Therapy: Individual Therapy  Treatment Goals addressed: Copinganxiety  Interventions: CBT  Summary: Kristi Lawrence is a 40 y.o. female who presents with anxiety and decreasing depression.   Suicidal/Homicidal: Nowithout intent/plan  Therapist Response: I met with the client indicated that she did have been a couple of days. She indicated that the mediation hearing that was supposed to take place on the 26th did not because her husband did not show up. She indicated that he has one other day but he can show up in December and that if he does not do with the legal action taken against him. She also indicated that her attorney is going to subpoena her daughter. The client indicated a discomfort with that but also recognizes that she feels her husband may be having too much of an influence on the client's daughter and there are things that the client is aware of that she may not tell unless subpoenaed. The client indicated that things are going well otherwise. She indicates that she has developed a close network of friends, most of which are males. We talked at length about proceeding with caution in maintaining just friendships with those relationships. She indicates that is all she is doing. She has spoken to her cousin who lives in [place] and still has plans to move to [place]. We talked about the pros and cons of the possible move as well as a possible move with her sister who lives in [place]. We talked about the clients anxiety in relation to upcoming hearing. She does present with some anxiety but indicates that she knows it is the only way that she will get her husband to make any type of response to her. She indicates that the next hearing will also include a division of property hearing which she has been trying to get from her husband for months. The client  reported that she has not seen her daughter into half months and that is a source of stress for her and feels that the only way to deal with that is legally. The client reports that she has no suicidal ideation and does contract for safety. I will meet with her again in 2 weeks  Plan: Return again in 2 weeks.  Diagnosis: Axis I: 296.26    Axis II: Deferred    French Ana, [facility] 08/23/2011

## 2011-08-28 ENCOUNTER — Telehealth: Payer: Self-pay | Admitting: *Deleted

## 2011-08-28 NOTE — Telephone Encounter (Signed)
Should be almost 100% but rarely can happen. If worried she is pregnant we can test her.

## 2011-08-28 NOTE — Telephone Encounter (Signed)
Pt notifeid of results. KJ LPN 

## 2011-08-28 NOTE — Telephone Encounter (Signed)
Pt had a tubal ligation done around 8 years ago and was wanting to know what the risk of getting pregnant was with this.

## 2011-09-04 ENCOUNTER — Telehealth (HOSPITAL_COMMUNITY): Payer: Self-pay

## 2011-09-06 ENCOUNTER — Ambulatory Visit (INDEPENDENT_AMBULATORY_CARE_PROVIDER_SITE_OTHER): Payer: 59 | Admitting: Behavioral Health

## 2011-09-06 DIAGNOSIS — F3340 Major depressive disorder, recurrent, in remission, unspecified: Secondary | ICD-10-CM

## 2011-09-06 DIAGNOSIS — F411 Generalized anxiety disorder: Secondary | ICD-10-CM

## 2011-09-06 DIAGNOSIS — F3342 Major depressive disorder, recurrent, in full remission: Secondary | ICD-10-CM

## 2011-09-08 ENCOUNTER — Encounter (HOSPITAL_COMMUNITY): Payer: Self-pay | Admitting: Behavioral Health

## 2011-09-08 NOTE — Progress Notes (Signed)
THERAPIST PROGRESS NOTE  Session Time: 9:00  Participation Level: Active  Behavioral Response: Well GroomedAlertAnxious  Type of Therapy: Individual Therapy  Treatment Goals addressed: Anxiety  Interventions: CBT  Summary: Kristi Lawrence is a 40 y.o. female who presents with anxiety and depression which is in remission.   Suicidal/Homicidal: Nowithout intent/plan  Therapist Response: I met with the client and she indicates that she continues to do well. She indicates that she continues to connect with relationships that have been neglected during her marriage. She indicated that she spent the weekend with her brother which went well. The client has also spent time with her mother and father, her sister. She has made new friends in [place] and reestablished former relationships. She indicated that she continues to be physically active taking walks, riding her bike or walking her dog on almost a daily basis. She indicated that she has become settled in the home and she  does still speak of moving to [place] or [place] to live with her cousin in [place] older sister in [place] at sometime in the future. She does indicate some financial stress as her part-time job provides limited income. Reports that she is in the best physical shape as she has been in and is only 2 or 3 pounds from her weight loss goal. She indicates that she has done several things such as are wearing makeup and buying some new clothes because she finally is beginning to feel good about herself. She does report sadness because she has not seen her daughter in what she describes is about 4 months. She indicates that she is looking forward to the hearing next week because of the opportunity to be able to see her daughter on a regular basis. She does indicate some anxiety about the hearing because she is unsure what may come up during the hearing. She is aware that her suicide attempts in the past may become an issue so  we processed how she would deal with that in the hearing setting. I asked if she had any suicidal thoughts or ideation at all and she replied" I am so over that." She indicated that she has no longer any thoughts of hurting or killing herself. She indicated that she is staying focused on being able to spend time with her daughter. We processed what that would look like when she does see her daughter since she has not seen her in months. She is concerned about what the daughter may have heard from her husband or from her husband's girlfriend. We talked at length about the changes that the daughter will see in her in that it may take some time for rebuilding their relationship since they have not been together in months. The client is aware of that and prepared to re-establish that relationship. The client indicated that for years she tried to make her marriage work and was extremely depressed and she felt it was not working. She reiterated that all she asked for from the husband is was affection and attention and she felt he did not give that to her. In describing the marriage she indicated that she is" happy to be out of it" and" said that she now realizes that she felt like she was nothing more than a" roommate and slave" to her husband. She indicated that she kept the house clean, always had dinner ready, and took care of the yard, as well as helped repair anything in the house that was not working  as well as repair their automobiles  I gave the client the option to meet before or after the hearing and she indicated she would be comfortable meeting after the hearing. I am pleased with the progress that the client is making in taking control of her life, reaching out to people who she had not reached out to previously or in a long time, and how well she is using coping skills we have discussed, and how well she is taking care of herself physically.  Plan: Return again in 2 weeks.  Diagnosis: Axis I: 296.36  300.02    Axis II: Deferred    French Ana, [facility] 09/08/2011

## 2011-09-08 NOTE — Progress Notes (Signed)
THERAPIST PROGRESS NOTE  Session Time: 9:00  Participation Level: Active  Behavioral Response: Well GroomedAlertAnxious  Type of Therapy: Individual Therapy  Treatment Goals addressed: Anxiety  Interventions: CBT  Summary: Kristi Lawrence is a 40 y.o. female who presents with anxiety and depression in remission.   Suicidal/Homicidal: Nowithout intent/plan  Therapist Response: See note  Plan: Return again in 2 weeks.  Diagnosis: Axis I: 296.36 300.02    Axis II: Deferred    Hendricks Limes 09/08/2011   THERAPIST PROGRESS NOTE  Session Time: 9:00  Participation Level: Active  Behavioral Response: Well GroomedAlertAnxious  Type of Therapy: Individual Therapy  Treatment Goals addressed: Anxiety  Interventions: CBT  Summary: Kristi Lawrence is a 40 y.o. female who presents with anxiety and depression which is in remission.   Suicidal/Homicidal: Nowithout intent/plan  Therapist Response: I met with the client and she indicates that she continues to do well. She indicates that she continues to connect with relationships that have been neglected during her marriage. She indicated that she spent the weekend with her brother which went well. The client has also spent time with her mother and father, her sister. She has made new friends in [place] and reestablished former relationships. She indicated that she continues to be physically active taking walks, riding her bike or walking her dog on almost a daily basis. She indicated that she has become settled in the home and she  does still speak of moving to [place] or [place] to live with her cousin in [place] older sister in [place] at sometime in the future. She does indicate some financial stress as her part-time job provides limited income. Reports that she is in the best physical shape as she has been in and is only 2 or 3 pounds from her weight loss goal. She indicates that she has done several things such  as are wearing makeup and buying some new clothes because she finally is beginning to feel good about herself. She does report sadness because she has not seen her daughter in what she describes is about 4 months. She indicates that she is looking forward to the hearing next week because of the opportunity to be able to see her daughter on a regular basis. She does indicate some anxiety about the hearing because she is unsure what may come up during the hearing. She is aware that her suicide attempts in the past may become an issue so we processed how she would deal with that in the hearing setting. I asked if she had any suicidal thoughts or ideation at all and she replied" I am so over that." She indicated that she has no longer any thoughts of hurting or killing herself. She indicated that she is staying focused on being able to spend time with her daughter. We processed what that would look like when she does see her daughter since she has not seen her in months. She is concerned about what the daughter may have heard from her husband or from her husband's girlfriend. We talked at length about the changes that the daughter will see in her in that it may take some time for rebuilding their relationship since they have not been together in months. The client is aware of that and prepared to re-establish that relationship. The client indicated that for years she tried to make her marriage work and was extremely depressed and she felt it was not working. She reiterated that all she asked for  from the husband is was affection and attention and she felt he did not give that to her. In describing the marriage she indicated that she is" happy to be out of it" and" said that she now realizes that she felt like she was nothing more than a" roommate and slave" to her husband. She indicated that she kept the house clean, always had dinner ready, and took care of the yard, as well as helped repair anything in the house that  was not working as well as repair their automobiles  I gave the client the option to meet before or after the hearing and she indicated she would be comfortable meeting after the hearing. I am pleased with the progress that the client is making in taking control of her life, reaching out to people who she had not reached out to previously or in a long time, and how well she is using coping skills we have discussed, and how well she is taking care of herself physically.  Plan: Return again in 2 weeks.  Diagnosis: Axis I: 296.36 300.02    Axis II: Deferred    French Ana, [facility] 09/08/2011

## 2011-09-20 ENCOUNTER — Ambulatory Visit (INDEPENDENT_AMBULATORY_CARE_PROVIDER_SITE_OTHER): Payer: 59 | Admitting: Behavioral Health

## 2011-09-20 DIAGNOSIS — IMO0002 Reserved for concepts with insufficient information to code with codable children: Secondary | ICD-10-CM

## 2011-09-20 DIAGNOSIS — F3341 Major depressive disorder, recurrent, in partial remission: Secondary | ICD-10-CM

## 2011-09-20 DIAGNOSIS — F411 Generalized anxiety disorder: Secondary | ICD-10-CM

## 2011-09-21 ENCOUNTER — Encounter (HOSPITAL_COMMUNITY): Payer: Self-pay | Admitting: Behavioral Health

## 2011-09-21 NOTE — Progress Notes (Signed)
   THERAPIST PROGRESS NOTE  Session Time: 9:00  Participation Level: Active  Behavioral Response: Well GroomedAlertAnxious/angry Type of Therapy: Individual Therapy  Treatment Goals addressed: Coping  Interventions: CBT  Summary: Kristi Lawrence is a 40 y.o. female who presents with depression and anxiety.   Suicidal/Homicidal: Nowithout intent/plan  Therapist Response: The client present with mixed emotions. She indicated that she was a little sad because she did not feel at the hearing one as well as she wanted in terms of her being able to see her daughter. She indicated that she will be able to see her daughter 4 times over the next 8 weeks for 4 hours at a time with supervision. She indicated that the supervision will be with the mother of one of her daughter's friends. She indicated that the initial meeting was supposed to have been this past Friday and that the client's husband text that her saying" no go" to the client being able to see her daughter. She indicated that it is set up again for this weekend and that if the husband does not bring her daughter to see her that she will take legal action in contact her attorney. She indicated that her attorney thought the hearing with pretty well but that her husband was not truthful telling the judge that the client had been depressed for 3 years. She asked what the client said to be a told her that based on my recollection he had told me that the depression started 2 years ago when her aunt died. The client was in good spirits otherwise. She did indicate that the judge ordered that she cannot leave the state with her daughter so she may stay in the area longer but she appears to have accepted that. She indicated that she has developed a comfortable social circle in this area and also has gotten closer to family members and other areas and feels that she is in a much better place. She continues to say that she is thankful that she is out of the  marriage and that is listed her depression significantly. She does admit to some bouts of sadness in particular related to not being he was see her daughter very often although she is looking forward to the first meeting in months. She also indicated that she has some financial struggles only working part-time. She also celebrated in the fact that she has spoken to the admissions and financially officials at [employer] where she will start school next semester. She indicated that she is moving on with life and for the first time in years doing something to better her life.  Plan: Return again in 2 weeks.  Diagnosis: Axis I: 296.35/300.02    Axis II: Deferred    French Ana, [facility] 09/21/2011

## 2011-09-28 ENCOUNTER — Ambulatory Visit (INDEPENDENT_AMBULATORY_CARE_PROVIDER_SITE_OTHER): Payer: 59 | Admitting: Family Medicine

## 2011-09-28 ENCOUNTER — Encounter: Payer: Self-pay | Admitting: Family Medicine

## 2011-09-28 VITALS — BP 125/77 | HR 85 | Ht 66.5 in | Wt 147.0 lb

## 2011-09-28 DIAGNOSIS — Z23 Encounter for immunization: Secondary | ICD-10-CM

## 2011-09-28 DIAGNOSIS — E789 Disorder of lipoprotein metabolism, unspecified: Secondary | ICD-10-CM

## 2011-09-28 DIAGNOSIS — Z0000 Encounter for general adult medical examination without abnormal findings: Secondary | ICD-10-CM

## 2011-09-28 DIAGNOSIS — L659 Nonscarring hair loss, unspecified: Secondary | ICD-10-CM

## 2011-09-28 LAB — POCT URINALYSIS DIPSTICK
Blood, UA: NEGATIVE
Glucose, UA: NEGATIVE
Spec Grav, UA: 1.025
Urobilinogen, UA: 0.2

## 2011-09-28 NOTE — Patient Instructions (Signed)
Start a regular exercise program and make sure you are eating a healthy diet Try to eat 4 servings of dairy a day or take a calcium supplement (500mg twice a day). Your vaccines are up to date.   

## 2011-09-28 NOTE — Progress Notes (Signed)
  Subjective:     Kristi Lawrence is a 41 y.o. female and is here for a comprehensive physical exam. The patient reports no problems.She stopped her lithium 4 months ago after seperatig from her husband. She was really struggling with her mood for the last 1- 2 years because of marital difficulty. She is actually glad they have seperated and is feeling much better. Last pap in April w/ GYN.   History   Social History  . Marital Status: Married    Spouse Name: SHANTRELL PLACZEK    Number of Children: 1  . Years of Education: assoc degr   Occupational History  . janitor    Social History Main Topics  . Smoking status: Current Some Day Smoker    Types: Cigarettes  . Smokeless tobacco: Never Used  . Alcohol Use: 0.0 oz/week     Client reports one to three alcoholic beverages monthly  . Drug Use: No  . Sexually Active: Yes -- Female partner(s)   Other Topics Concern  . Not on file   Social History Narrative   No regular exercise.  No caffeinePoor calcium intake. Seperated from her husband.     Health Maintenance  Topic Date Due  . Influenza Vaccine  06/25/2012  . Pap Smear  01/23/2014  . Tetanus/tdap  09/27/2021    The following portions of the patient's history were reviewed and updated as appropriate: allergies, current medications, past family history, past medical history, past social history, past surgical history and problem list.  Review of Systems A comprehensive review of systems was negative.   Objective:    BP 125/77  Pulse 85  Ht 5' 6.5" (1.689 m)  Wt 147 lb (66.679 kg)  BMI 23.37 kg/m2 General appearance: alert, cooperative and appears stated age Head: Normocephalic, without obvious abnormality, atraumatic Eyes: conj clear, EOMI, PEERLA Ears: normal TM's and external ear canals both ears Nose: Nares normal. Septum midline. Mucosa normal. No drainage or sinus tenderness. Throat: lips, mucosa, and tongue normal; teeth and gums normal Neck: no adenopathy, no carotid  bruit, no JVD, supple, symmetrical, trachea midline and thyroid not enlarged, symmetric, no tenderness/mass/nodules Back: symmetric, no curvature. ROM normal. No CVA tenderness. Lungs: clear to auscultation bilaterally Heart: regular rate and rhythm, S1, S2 normal, no murmur, click, rub or gallop Abdomen: soft, non-tender; bowel sounds normal; no masses,  no organomegaly Extremities: extremities normal, atraumatic, no cyanosis or edema Pulses: 2+ and symmetric Skin: Skin color, texture, turgor normal. No rashes or lesions Lymph nodes: Cervical, supraclavicular, and axillary nodes normal. Neurologic: Alert and oriented X 3, normal strength and tone. Normal symmetric reflexes. Normal coordination and gait    Assessment:    Healthy female exam.      Plan:     See After Visit Summary for Counseling Recommendations  Start a regular exercise program and make sure you are eating a healthy diet Try to eat 4 servings of dairy a day or take a calcium supplement (500mg  twice a day). Due for repeat cholesterol since was high in April.   Also givne Tdap today.  Needs MMR titers drawn for school.   Form completed.

## 2011-09-29 LAB — LIPID PANEL
LDL Cholesterol: 90 mg/dL (ref 0–99)
Triglycerides: 50 mg/dL (ref <150)
VLDL: 10 mg/dL (ref 0–40)

## 2011-10-05 ENCOUNTER — Ambulatory Visit (HOSPITAL_COMMUNITY): Payer: 59 | Admitting: Behavioral Health

## 2011-10-05 ENCOUNTER — Encounter (HOSPITAL_COMMUNITY): Payer: Self-pay | Admitting: Behavioral Health

## 2011-10-05 DIAGNOSIS — F3341 Major depressive disorder, recurrent, in partial remission: Secondary | ICD-10-CM

## 2011-10-05 DIAGNOSIS — F411 Generalized anxiety disorder: Secondary | ICD-10-CM

## 2011-10-05 LAB — ESTRADIOL, FREE: Estradiol: 79 pg/mL

## 2011-10-05 NOTE — Progress Notes (Unsigned)
   THERAPIST PROGRESS NOTE  Session Time: 9:00  Participation Level: Active  Behavioral Response: Well GroomedAlertAnxious/depressed  Type of Therapy: Individual Therapy  Treatment Goals addressed: Anxiety/anger  Interventions: CBT  Summary: Kristi Lawrence is a 41 y.o. female who presents with anxiety and depression.   Suicidal/Homicidal: Nowithout intent/plan  Therapist Response: The client indicated that she is struggling with some anxiety and depression related to her most recent visit with her daughter. She indicated that she met her daughter pains mall and that the supervision was a daughters 40 year old boyfriend and his friend. The client indicated that her attorney decided that would be okay supervision. Do not understand why a 40 year old be allowed to supervise the client meeting with her daughter for the first time in months. The client indicated that the visit went okay for the first couple of hours but that they're in a being disagreements and arguments between the daughter her boyfriend and the client. She indicated that she was attempting to point out what she has done for the daughter and to correct any [place] police about herself that the daughter may have gotten from her husband and his girlfriend. She indicated that she became frustrated because was conversations when and now feels that the daughter does not want to see her again. We processed the clients feelings related to this told her that should she has not seen her daughter months and that she had been in a house with her husband and husband's girlfriend that the daughter was going to probably have some false beliefs and that it would take time for the daughter to see that the client has changed him to correct some of those beliefs. The client did indicate that she had thoughts of cutting her arms as result of the anxiety and depression create about that meeting but use cop the client does not report any suicidal ideation.  The client and I did agree that would see her on a weekly basis for now.  Return again in 1 weeks.  Diagnosis: Axis I: 296.35/300.02    Axis II: Deferred    French Ana, [facility] 10/05/2011

## 2011-10-13 ENCOUNTER — Ambulatory Visit (HOSPITAL_COMMUNITY): Payer: Self-pay | Admitting: Behavioral Health

## 2011-10-19 ENCOUNTER — Telehealth: Payer: Self-pay | Admitting: *Deleted

## 2011-10-19 ENCOUNTER — Other Ambulatory Visit: Payer: Self-pay | Admitting: *Deleted

## 2011-10-19 ENCOUNTER — Ambulatory Visit (INDEPENDENT_AMBULATORY_CARE_PROVIDER_SITE_OTHER): Payer: 59 | Admitting: Behavioral Health

## 2011-10-19 DIAGNOSIS — F324 Major depressive disorder, single episode, in partial remission: Secondary | ICD-10-CM

## 2011-10-19 DIAGNOSIS — Z0000 Encounter for general adult medical examination without abnormal findings: Secondary | ICD-10-CM

## 2011-10-19 DIAGNOSIS — IMO0002 Reserved for concepts with insufficient information to code with codable children: Secondary | ICD-10-CM

## 2011-10-19 DIAGNOSIS — Z0184 Encounter for antibody response examination: Secondary | ICD-10-CM

## 2011-10-19 NOTE — Telephone Encounter (Signed)
labs

## 2011-10-20 ENCOUNTER — Telehealth: Payer: Self-pay | Admitting: *Deleted

## 2011-10-20 DIAGNOSIS — Z0184 Encounter for antibody response examination: Secondary | ICD-10-CM

## 2011-10-20 NOTE — Telephone Encounter (Signed)
Labs

## 2011-10-23 LAB — MUMPS ANTIBODY, IGG: Mumps IgG: 1.18 {ISR} — ABNORMAL HIGH

## 2011-10-27 ENCOUNTER — Encounter (HOSPITAL_COMMUNITY): Payer: Self-pay | Admitting: Behavioral Health

## 2011-10-27 NOTE — Progress Notes (Signed)
   THERAPIST PROGRESS NOTE  Session Time: 4:00  Participation Level: Active  Behavioral Response: CasualAlertAngry  Type of Therapy: Individual Therapy  Treatment Goals addressed: Anger  Interventions: CBT  Summary: DANAISHA CELLI is a 41 y.o. female who presents with depression .   Suicidal/Homicidal: Nowithout intent/plan  Therapist Response: The client indicated she was angry as she entered the session because her husband was supposed to have brought her daughter to the therapy session so that the client and daughter could a sense of time together and be begin to work out some issues. The client other than the visit around the holidays with her daughter and daughter's boyfriend and his friend at the mall has not seen her daughter since then. The client indicated that there are supposed to be times that she's to see the daughter but the husband is not compliant with what the hearing determined as far as amount of time the client did see her daughter. The client has made her attorney where this. She also indicated that her husband is attempting to get the client to pay child support and her attorney her the client reports is that she may have to pay some child support. The client indicates that she has no idea how she would make that happen and still focused on being able to spend some time with her daughter. Client did say that she is still doing well. She indicated that she is not smoking as much and she has no intention of self-harm anyway including cutting. She reports no suicidal or homicidal ideation. She indicates that she continues to work out regularly. In [place] that she has started schooling classes at begun. She indicates this is something that she has wanted to do for a long time and even though she is concerned about taking of school loans this gives her a chance to do something she has always wanted to do and to keep her self focused and positive. The client is taking 4 or 5  classes which he indicates will be challenging. She indicates that she still has a strong support group from friends that she has made since her separation including a neighbor as well as with her parents and his sister. She indicates that she is enjoying being social and that when she was married her only social life was with her husband and that they never did anything. 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 Although the client was frustrated and angry today she remains positive and not beat and is making healthy positive changes in her life.  Plan: Return again in 2 weeks.  Diagnosis: Axis I: 296.25    Axis II: Deferred    French Ana, [facility] 10/27/2011

## 2011-10-30 ENCOUNTER — Telehealth: Payer: Self-pay | Admitting: *Deleted

## 2011-10-30 NOTE — Telephone Encounter (Signed)
She can come here.  We can also see if we can order it from [facility] separately.

## 2011-10-30 NOTE — Telephone Encounter (Signed)
Pt  Notified can come here for MMR

## 2011-10-30 NOTE — Telephone Encounter (Signed)
Pt calls and states the [facility] told her they could only do   The MMR because they do not do just the rubella and it was [place] cost her 86.00. Pt wants to know if she can come here and get the MMR. Pt was immune to measles and mumps but not rubella. Is this ok

## 2011-11-01 ENCOUNTER — Ambulatory Visit (INDEPENDENT_AMBULATORY_CARE_PROVIDER_SITE_OTHER): Payer: 59 | Admitting: Family Medicine

## 2011-11-01 DIAGNOSIS — Z23 Encounter for immunization: Secondary | ICD-10-CM

## 2011-11-01 NOTE — Progress Notes (Signed)
Patient ID: Kristi Lawrence, female   DOB: 04-06-1971, 41 y.o.   MRN: [id number] MMR vaccine needed for school

## 2011-11-03 ENCOUNTER — Ambulatory Visit (INDEPENDENT_AMBULATORY_CARE_PROVIDER_SITE_OTHER): Payer: 59 | Admitting: Behavioral Health

## 2011-11-03 DIAGNOSIS — F324 Major depressive disorder, single episode, in partial remission: Secondary | ICD-10-CM

## 2011-11-03 DIAGNOSIS — F411 Generalized anxiety disorder: Secondary | ICD-10-CM

## 2011-11-03 DIAGNOSIS — IMO0002 Reserved for concepts with insufficient information to code with codable children: Secondary | ICD-10-CM

## 2011-11-06 ENCOUNTER — Encounter (HOSPITAL_COMMUNITY): Payer: Self-pay | Admitting: Behavioral Health
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
## 2011-11-17 ENCOUNTER — Ambulatory Visit (INDEPENDENT_AMBULATORY_CARE_PROVIDER_SITE_OTHER): Payer: 59 | Admitting: Behavioral Health

## 2011-11-17 DIAGNOSIS — IMO0002 Reserved for concepts with insufficient information to code with codable children: Secondary | ICD-10-CM

## 2011-11-17 DIAGNOSIS — F324 Major depressive disorder, single episode, in partial remission: Secondary | ICD-10-CM

## 2011-11-20 ENCOUNTER — Encounter (HOSPITAL_COMMUNITY): Payer: Self-pay | Admitting: Behavioral Health
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
## 2011-11-21 IMAGING — US US PELVIS COMPLETE
1 series · 14 of 25 positions shown · non-contrast
Comparison: None.

CLINICAL DATA: Pelvic pain



[Series 1: us pelvis complete · 0.28mm/px · 14 of 38 slices shown]
[im 1/38]
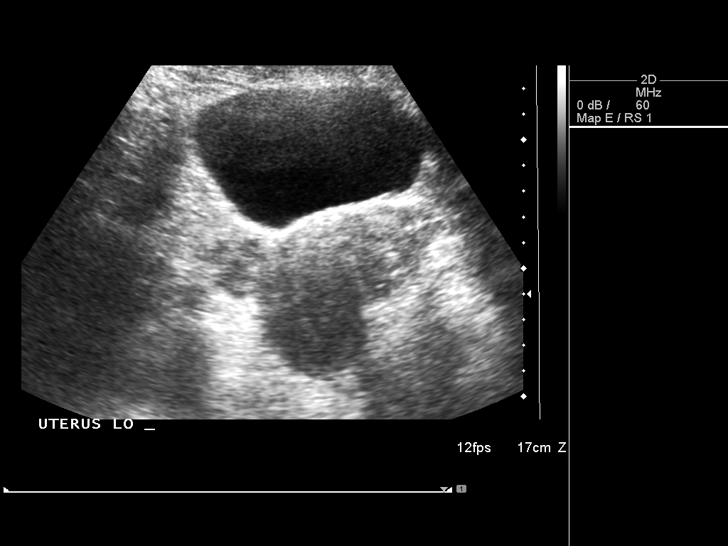
[im 4/38]
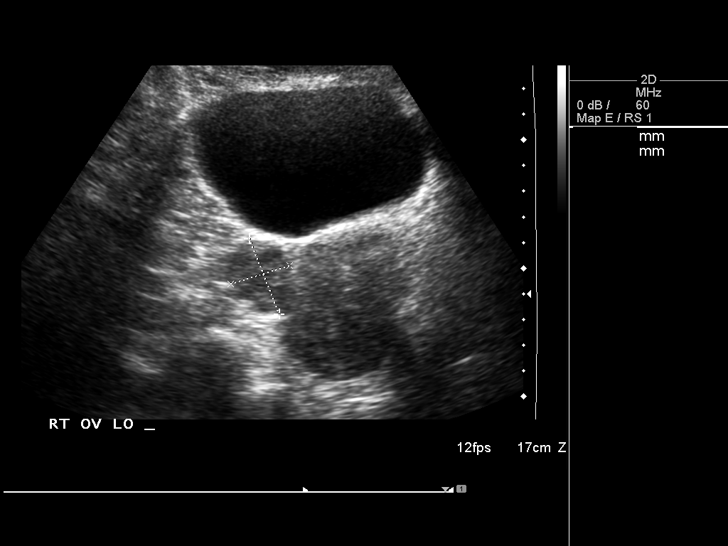
[im 7/38]
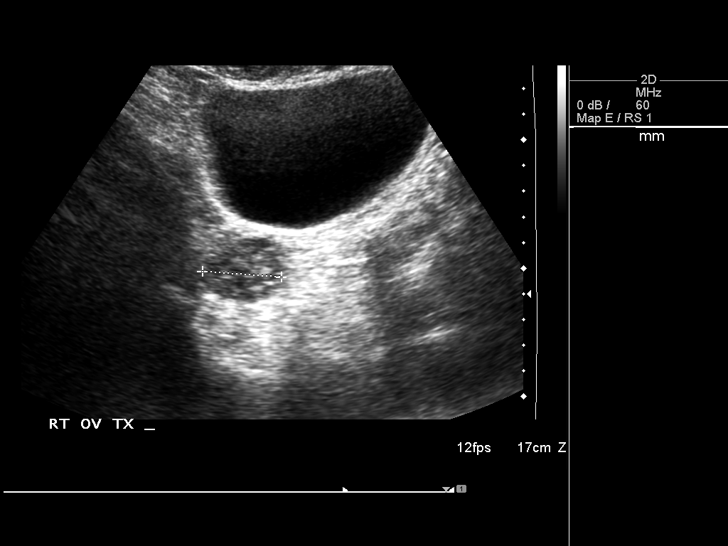
[im 10/38]
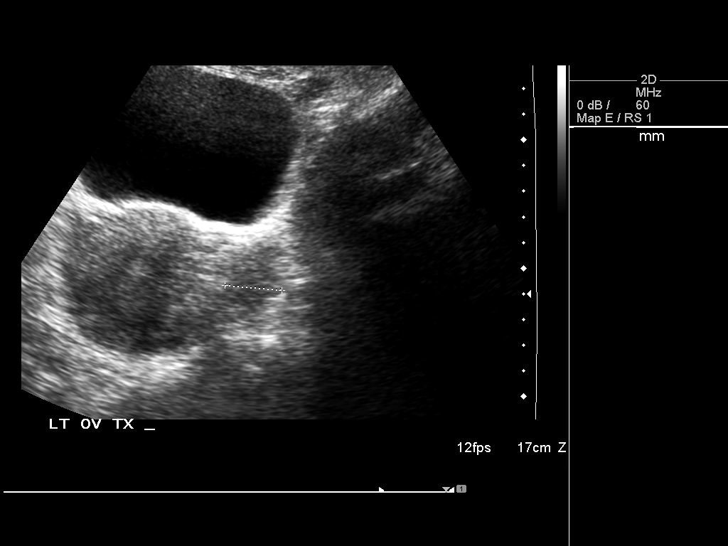
[im 13/38]
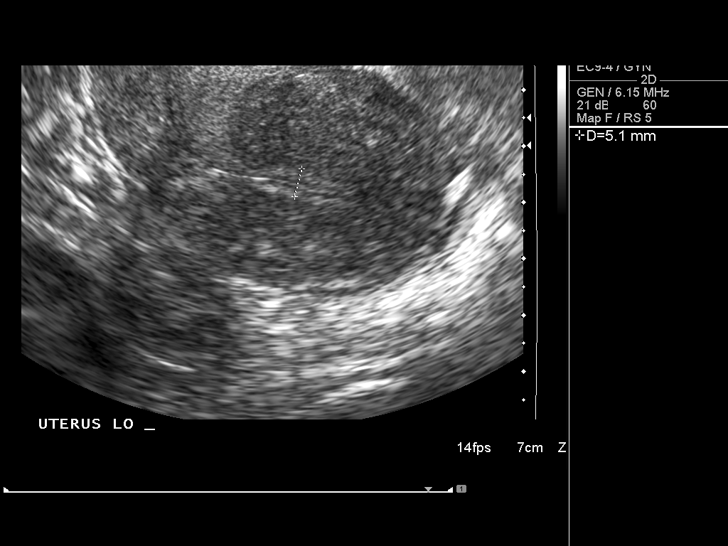
[im 14/38]
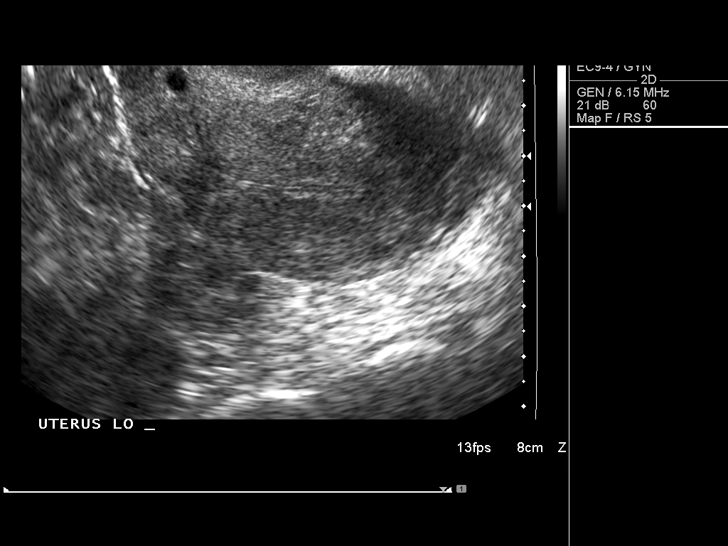
[im 17/38]
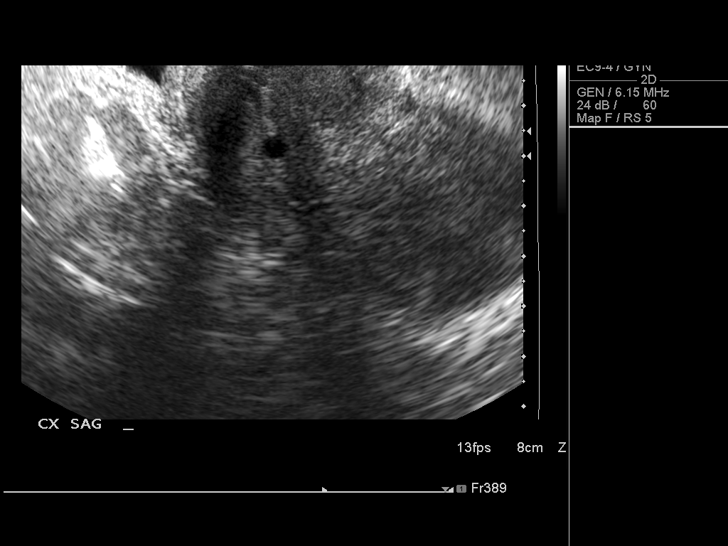
[im 21/38]
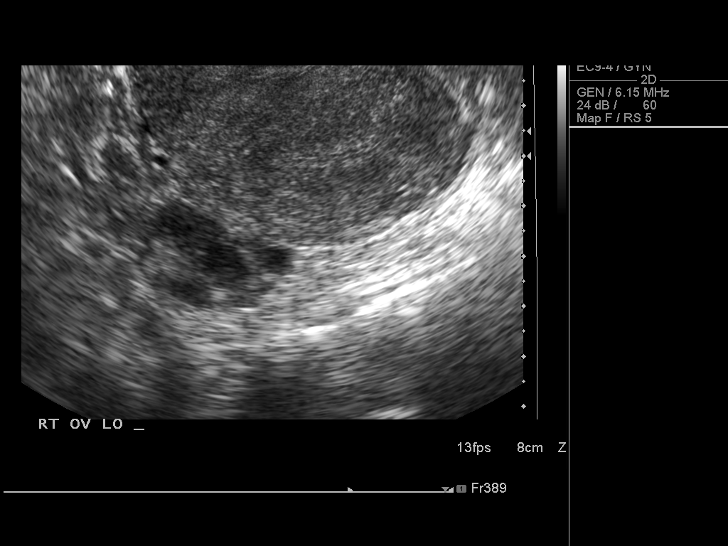
[im 24/38]
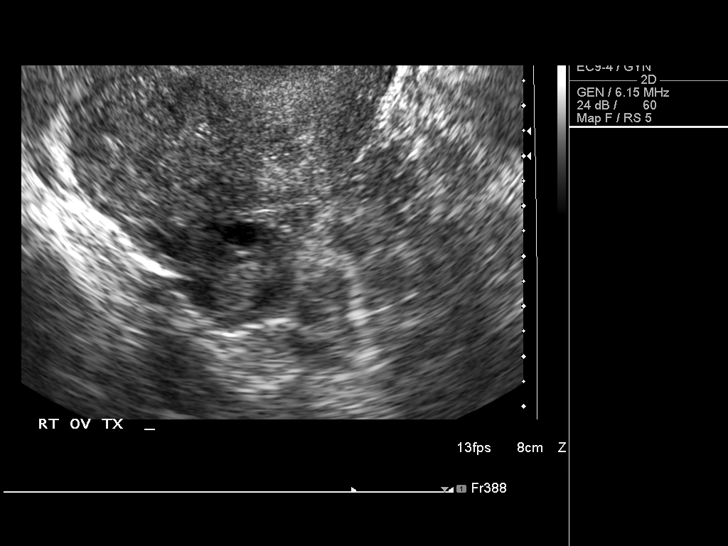
[im 25/38]
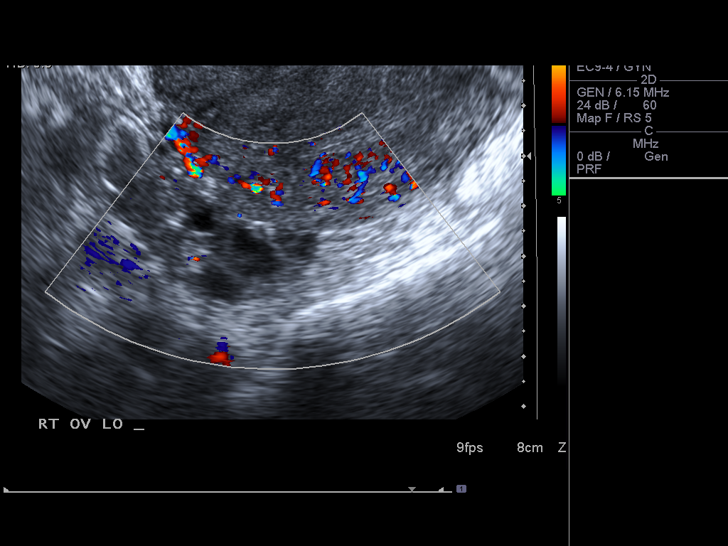
[im 28/38]
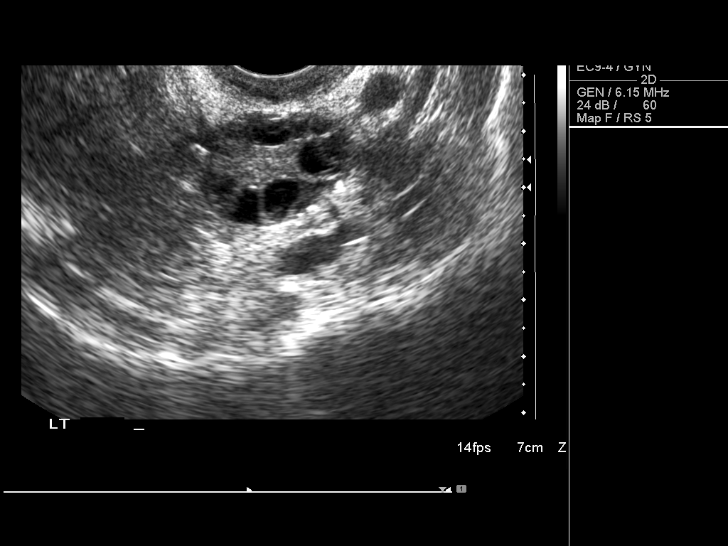
[im 31/38]
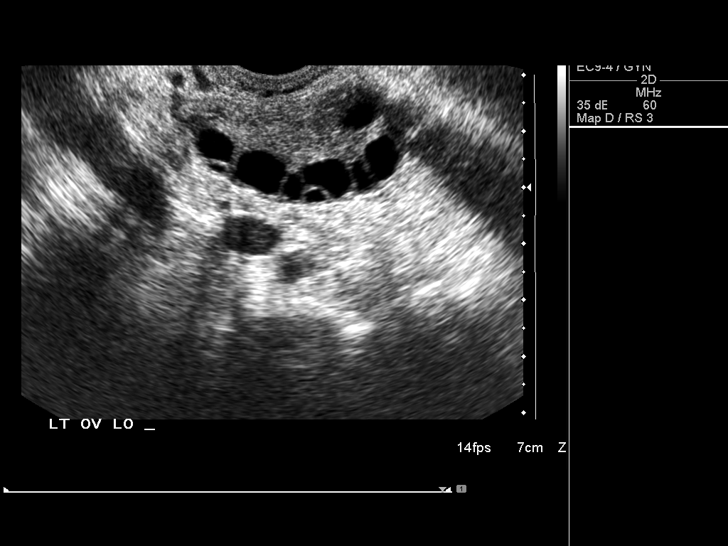
[im 34/38]
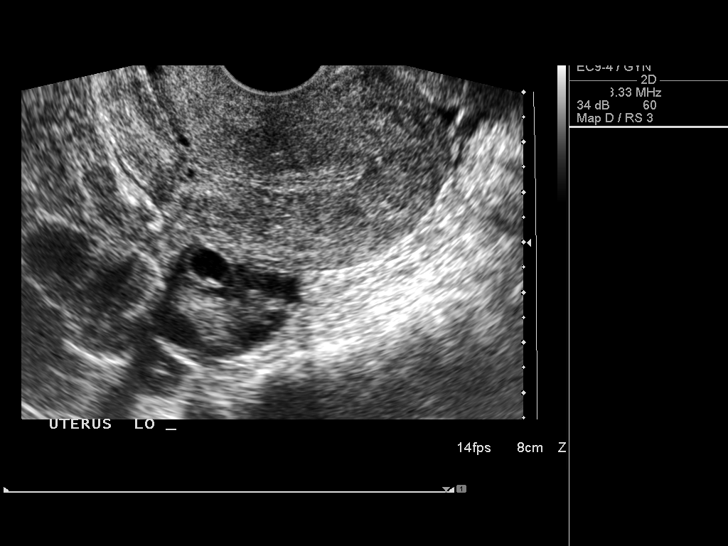
[im 38/38]
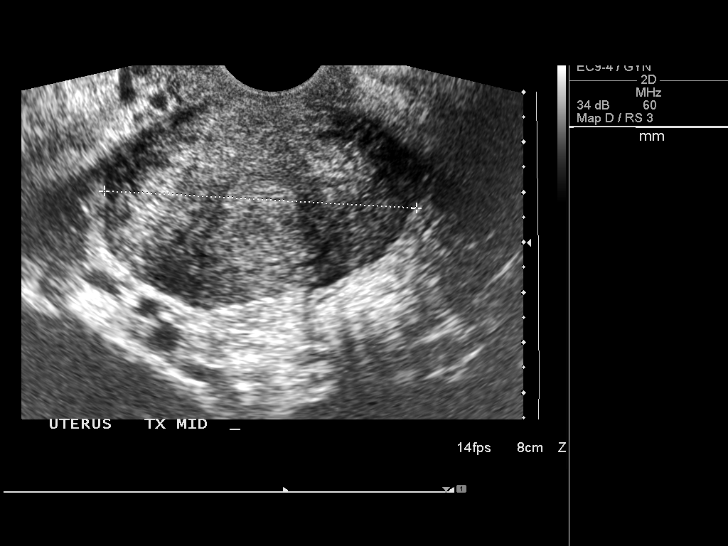

[14 of 25 positions shown; findings below may reference images not displayed]

FINDINGS: The uterus is normal in size and echotexture, measuring
8.4 x 4.2 x 6.2 cm.  Endometrial stripe is thin and homogeneous,
measuring 5 mm in width.

Both ovaries are normal in size and appearance.  The right ovary
measures 2.7 x 1.9 x 2.5 cm, and the left ovary measures 3.6 x
x 2.7 cm.  Multiple small physiologic follicles are present on both
ovaries.  There are no adnexal masses.  Trace free pelvic fluid is
noted.
IMPRESSION: Normal pelvic ultrasound.

## 2011-12-01 ENCOUNTER — Ambulatory Visit (INDEPENDENT_AMBULATORY_CARE_PROVIDER_SITE_OTHER): Payer: 59 | Admitting: Behavioral Health

## 2011-12-01 ENCOUNTER — Other Ambulatory Visit: Payer: Self-pay | Admitting: *Deleted

## 2011-12-01 ENCOUNTER — Ambulatory Visit (INDEPENDENT_AMBULATORY_CARE_PROVIDER_SITE_OTHER): Payer: 59 | Admitting: Family Medicine

## 2011-12-01 VITALS — BP 137/90 | HR 81

## 2011-12-01 DIAGNOSIS — IMO0002 Reserved for concepts with insufficient information to code with codable children: Secondary | ICD-10-CM

## 2011-12-01 DIAGNOSIS — Z23 Encounter for immunization: Secondary | ICD-10-CM

## 2011-12-01 DIAGNOSIS — F3341 Major depressive disorder, recurrent, in partial remission: Secondary | ICD-10-CM

## 2011-12-01 MED ORDER — DICLOFENAC SODIUM 75 MG PO TBEC: 75.0000 mg | DELAYED_RELEASE_TABLET | Freq: Two times a day (BID) | ORAL | Status: DC

## 2011-12-01 NOTE — Telephone Encounter (Signed)
Pt informed

## 2011-12-01 NOTE — Telephone Encounter (Signed)
Pt states that the Naproxen is causing some depression and would like to know if we can give her something different for the pain. Please advise.

## 2011-12-01 NOTE — Telephone Encounter (Signed)
I will send over a different anti-inflammatory. If she feels her depression is persisting and she needs to make an office visit because she does have a prior history of depression.

## 2011-12-01 NOTE — Progress Notes (Signed)
  Subjective:    Patient ID: Kristi Lawrence, female    DOB: 1970/12/17, 41 y.o.   MRN: [id number] MMR injection HPI    Review of Systems     Objective:   Physical Exam        Assessment & Plan:

## 2011-12-05 ENCOUNTER — Encounter (HOSPITAL_COMMUNITY): Payer: Self-pay | Admitting: Behavioral Health
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
## 2011-12-15 ENCOUNTER — Ambulatory Visit (INDEPENDENT_AMBULATORY_CARE_PROVIDER_SITE_OTHER): Payer: 59 | Admitting: Behavioral Health

## 2011-12-15 ENCOUNTER — Encounter (HOSPITAL_COMMUNITY): Payer: Self-pay | Admitting: Behavioral Health

## 2011-12-15 DIAGNOSIS — F324 Major depressive disorder, single episode, in partial remission: Secondary | ICD-10-CM

## 2011-12-15 DIAGNOSIS — IMO0002 Reserved for concepts with insufficient information to code with codable children: Secondary | ICD-10-CM

## 2011-12-15 NOTE — Progress Notes (Signed)
   THERAPIST PROGRESS NOTE  Session Time: 10;;00  Participation Level: Active  Behavioral Response: Well GroomedAlertDepressed/anxious  Type of Therapy: Individual Therapy  Treatment Goals addressed: Coping  Interventions: CBT  Summary: Kristi Lawrence is a 41 y.o. female who presents with depression.   Suicidal/Homicidal: Nowithout intent/plan  Therapist Response: The client began the session by showing me an e-mail that her husband had sent to her attorney in terms of what he will or will not do in terms of with a mediator initially had requested. He indicated that he has no money to be able to comply with wishes and is only working a reduced amount of hours. The client told me that everything that the husband is said was not true and responded in kind to the attorney basal e-mail that came from her husband. The client indicated that she feels she is made breakthrough that she was initially angry when she read e-mail that the more she thought about realized that she cannot control whether he tells the truth or not and that she cannot allow that to continue to affect her negatively. She does indicate that she still has periods of depression because of the circumstances and because of not being able to see her daughter on a regular basis but feels that she is making significant positive strides in her life. She indicated that she made straight A's for the past quarter was very proud of herself. We talked at length about school has been a very positive distraction for which allowed her to focus and take her away from all the other factors he can be distracted her. She still indicates some financial struggles and is not sure of what will take place with intact money yet. She indicates she will be another hearing coming soon although she does not know a date in terms of property distribution. She indicated that her husband taken her off of his insurance but now it sounds as if the husband is having to  take out of pay for [employer] to cover the client at least for the time being. The client indicates that she wants to see her daughter but the last couple of meetings the daughter was accompanied by her boyfriend the client feels like to was the quality come for she and the daughter he continues to feel that her husband and his girlfriend are getting misinformation to the daughter. The client said that she knows truth and that eventually she feels she will be able to share that with her daughter. She indicated that her husband continues to twist her words an hour daughter believes that the client has said things about the daughter which the client indicates she has not said. The client does recognize that only time, quality time with the daughter of operation the most negative images that the client feels are being put in the daughter's head. The client indicates that she stays focused on friends who have been supportive through all of this is excited about her school career path. She does contract for safety indicating she has no suicidal or homicidal thoughts and has no plans to harm herself in any way.  Plan: Return again in 2 weeks.  Diagnosis: Axis I: 296.25    Axis II: Deferred    French Ana, [facility] 12/15/2011

## 2012-01-08 ENCOUNTER — Ambulatory Visit (INDEPENDENT_AMBULATORY_CARE_PROVIDER_SITE_OTHER): Payer: 59 | Admitting: Behavioral Health

## 2012-01-08 ENCOUNTER — Encounter (HOSPITAL_COMMUNITY): Payer: Self-pay | Admitting: Behavioral Health

## 2012-01-08 DIAGNOSIS — F324 Major depressive disorder, single episode, in partial remission: Secondary | ICD-10-CM

## 2012-01-08 DIAGNOSIS — IMO0002 Reserved for concepts with insufficient information to code with codable children: Secondary | ICD-10-CM

## 2012-01-08 NOTE — Progress Notes (Signed)
   THERAPIST PROGRESS NOTE  Session Time: 10:00  Participation Level: Active  Behavioral Response: NeatAlertAngry  Type of Therapy: Individual Therapy  Treatment Goals addressed: Coping  Interventions: CBT  Summary: Kristi Lawrence is a 41 y.o. female who presents with anxiety and depression.   Suicidal/Homicidal: Nowithout intent/plan  Therapist Response: The client entered the session angry indicating that she had just received a call from [employer] about her taxes. She indicated that now she has to pay [employer] $400 as well that she does federal tax his $1000. She indicated that she went from thinking she would be getting $1400 back to now in $1400 and wondering where the money come from. She indicated that she wanted to use a personal account of but that her husband since they were still married a cyst on [employer] and filed separately. She indicated that for some reason he only does $150 and has no idea what he owes and federal taxes. She indicated she would be getting nothing back from state [place]. She we'll her income was clear which is near poverty level. She is angry at the system appears to be taking a long time to take action that her husband is fighting back and not being honest with the system. She indicated that she has not seen her daughter because the last few times her visits are supervised by the daughter's boyfriend which proved very nonproductive. She indicates that there is another hearing on April 22 as far as material possessions and she hopes to get some of her things back because she needs them because she feels like her has no right to them since their worst to begin with. She feels like this will create significant chaos within the home if the judge does reward her with the computer, washer and dryer, while more etc. that she is asking for. The client wishes her daughter would call her the client has no addressed we contacted daughter. She continues to feel that the  husband and his girlfriend are have a negative influence on the daughter as evidenced by the way she treats the client and the times that they have met. The client does recognize that she is taking [employer] positive steps in her life and going back to school and establishing a network of friends and family that she can depend on. We talked about using cognitive behavioral therapy to replace negative thoughts. We talked about compartmentalizing stressors as well as taking difficult days in segment so they do not become overwhelming. We also talked about being future focused as the client recognizes she can have a much brighter opportunity once and things settled legally she is able to focus on school and to upcoming career. The client does still present with some depression indicating that she becomes sad which he takes about not seeing her daughter in which she feels things are not going as quickly as he could legally. She did indicate that she is not as depressed as she was wasn't feels like she is a new personality is not in the marriage.  Plan: Return again in 2 weeks.  Diagnosis: Axis I: 296.25    Axis II: Deferred    French Ana, [facility] 01/08/2012

## 2012-01-26 ENCOUNTER — Ambulatory Visit (INDEPENDENT_AMBULATORY_CARE_PROVIDER_SITE_OTHER): Payer: 59 | Admitting: Behavioral Health

## 2012-01-26 DIAGNOSIS — F3341 Major depressive disorder, recurrent, in partial remission: Secondary | ICD-10-CM

## 2012-01-26 DIAGNOSIS — IMO0002 Reserved for concepts with insufficient information to code with codable children: Secondary | ICD-10-CM

## 2012-01-30 ENCOUNTER — Encounter (HOSPITAL_COMMUNITY): Payer: Self-pay | Admitting: Behavioral Health
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
## 2012-02-09 ENCOUNTER — Encounter (HOSPITAL_COMMUNITY): Payer: Self-pay | Admitting: Behavioral Health

## 2012-02-09 ENCOUNTER — Ambulatory Visit (INDEPENDENT_AMBULATORY_CARE_PROVIDER_SITE_OTHER): Payer: 59 | Admitting: Behavioral Health

## 2012-02-09 DIAGNOSIS — IMO0002 Reserved for concepts with insufficient information to code with codable children: Secondary | ICD-10-CM

## 2012-02-09 DIAGNOSIS — F3341 Major depressive disorder, recurrent, in partial remission: Secondary | ICD-10-CM
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
## 2012-03-01 ENCOUNTER — Ambulatory Visit (INDEPENDENT_AMBULATORY_CARE_PROVIDER_SITE_OTHER): Payer: 59 | Admitting: Behavioral Health

## 2012-03-01 DIAGNOSIS — F3340 Major depressive disorder, recurrent, in remission, unspecified: Secondary | ICD-10-CM

## 2012-03-01 DIAGNOSIS — F3342 Major depressive disorder, recurrent, in full remission: Secondary | ICD-10-CM

## 2012-03-04 ENCOUNTER — Encounter (HOSPITAL_COMMUNITY): Payer: Self-pay | Admitting: Behavioral Health
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
## 2012-03-18 ENCOUNTER — Encounter (HOSPITAL_COMMUNITY): Payer: Self-pay | Admitting: Behavioral Health

## 2012-03-18 ENCOUNTER — Ambulatory Visit (INDEPENDENT_AMBULATORY_CARE_PROVIDER_SITE_OTHER): Payer: 59 | Admitting: Behavioral Health

## 2012-03-18 DIAGNOSIS — R451 Restlessness and agitation: Secondary | ICD-10-CM

## 2012-03-18 DIAGNOSIS — IMO0002 Reserved for concepts with insufficient information to code with codable children: Secondary | ICD-10-CM

## 2012-03-18 DIAGNOSIS — F3341 Major depressive disorder, recurrent, in partial remission: Secondary | ICD-10-CM

## 2012-03-18 NOTE — Progress Notes (Signed)
   THERAPIST PROGRESS NOTE  Session Time: 11:00  Participation Level: Active  Behavioral Response: CasualAlertDepressed/agitated  Type of Therapy: Individual Therapy  Treatment Goals addressed: Coping  Interventions: CBT  Summary: Kristi Lawrence is a 41 y.o. female who presents with depression.   Suicidal/Homicidal: Nowithout intent/plan  Therapist Response: I met with the client who indicated that her ex-husband continues to do interesting things. She indicated that she was agitated with him because he had not shown up for the past 2 hearing to determine custody of material possessions. She indicated that the judge told her attorney to send a letter saying that he had until the end of the week of June 24 to respond or he would be held in contempt of court. She indicated that he has not responded to that yet. She indicated that he had made accusations against her in a letter to her attorney that she was a pedophile and that she set up a page looking for 40 year old boys. The client indicated that she laughed at the absurdities of the claims saying she does not have a the countless someone else and her name and that she has no interest in anything remotely related to boys. She indicated that she is not using any drugs and has rare alcohol use. She indicates that she has no thoughts of suicide or homicide and did contract for safety. She reported that she is becoming agitated at waiting on the process to unfold. She indicated that she does believe that her husband is allowing her daughter to stay with her boyfriend all day long while he is at work and she is concerned about the influence not only living in a house with her father and his girlfriend are but the influence that the daughter's boyfriend is having on her. In spite of all the interventions irritations the client reports that she remains positive. She indicates that she still has financial stressors and is waiting on something to be settled  with the insurance process. Her husband was supposed to put her back was working church and has not yet been as being dealt with legally. She does indicate that school gave her a focus and purpose but that she could not afford to go to school during the summer and is looking forward to returning to school in August. She does report some stressors with one friendship but for the most part reports that she has continued to develop and maintain family and friends support. We talked about cognitive behavioral therapy and how her thoughts affects her feelings and behaviors. We talked about turning irrational thoughts into more rational thoughts. The client appears to feel fairly confident that the legal system we'll reward the material possessions that she feels more hers to start with and the husband has not given up.   Plan: Return again in 3 weeks.  Diagnosis: Axis I: 296.35/307.9    Axis II: Deferred    French Ana, [facility] 03/18/2012

## 2012-03-20 ENCOUNTER — Encounter: Payer: Self-pay | Admitting: Physician Assistant

## 2012-03-20 ENCOUNTER — Ambulatory Visit (INDEPENDENT_AMBULATORY_CARE_PROVIDER_SITE_OTHER): Payer: Commercial Indemnity | Admitting: Physician Assistant

## 2012-03-20 VITALS — BP 118/74 | HR 69 | Ht 65.0 in | Wt 147.0 lb

## 2012-03-20 DIAGNOSIS — L237 Allergic contact dermatitis due to plants, except food: Secondary | ICD-10-CM

## 2012-03-20 DIAGNOSIS — L255 Unspecified contact dermatitis due to plants, except food: Secondary | ICD-10-CM

## 2012-03-20 MED ORDER — METHYLPREDNISOLONE ACETATE 80 MG/ML IJ SUSP
80.0000 mg | Freq: Once | INTRAMUSCULAR | Status: AC
  Administered 2012-03-20: 80 mg via INTRAMUSCULAR

## 2012-03-20 NOTE — Patient Instructions (Addendum)
Will give Depo Medrol shot today.  Will call in steroid cream if rash does not go away.

## 2012-03-20 NOTE — Progress Notes (Signed)
  Subjective:    Patient ID: Kristi Lawrence, female    DOB: 02-14-1971, 41 y.o.   MRN: [id number]  HPI Patient exposed to poison ivy a couple of weeks ago. She has tried OTC calmine lotion and hydrocortisone. Her rash continues to spread. It started on her left wrist but is now on both legs and torso. No pain but lots of itching   Review of Systems     Objective:   Physical Exam  Constitutional: She appears well-developed and well-nourished.  HENT:  Head: Normocephalic and atraumatic.  Musculoskeletal:       Arms:      Legs: Psychiatric: She has a normal mood and affect. Her behavior is normal.          Assessment & Plan:  Poison Ivy dermatitis- Gave depo medrol 80mg . If does not completely go away can give steroid cream. Make sure to wash clothes that came in contact with IVY.

## 2012-03-29 ENCOUNTER — Ambulatory Visit (INDEPENDENT_AMBULATORY_CARE_PROVIDER_SITE_OTHER): Payer: 59 | Admitting: Family Medicine

## 2012-03-29 ENCOUNTER — Encounter: Payer: Self-pay | Admitting: Family Medicine

## 2012-03-29 VITALS — BP 132/80 | HR 80 | Temp 98.5°F | Ht 65.5 in | Wt 143.0 lb

## 2012-03-29 DIAGNOSIS — N390 Urinary tract infection, site not specified: Secondary | ICD-10-CM

## 2012-03-29 DIAGNOSIS — Z720 Tobacco use: Secondary | ICD-10-CM

## 2012-03-29 DIAGNOSIS — K0889 Other specified disorders of teeth and supporting structures: Secondary | ICD-10-CM

## 2012-03-29 DIAGNOSIS — Z1231 Encounter for screening mammogram for malignant neoplasm of breast: Secondary | ICD-10-CM

## 2012-03-29 DIAGNOSIS — K089 Disorder of teeth and supporting structures, unspecified: Secondary | ICD-10-CM

## 2012-03-29 DIAGNOSIS — F17200 Nicotine dependence, unspecified, uncomplicated: Secondary | ICD-10-CM

## 2012-03-29 LAB — POCT URINALYSIS DIPSTICK
Ketones, UA: NEGATIVE
Nitrite, UA: NEGATIVE
Urobilinogen, UA: 0.2

## 2012-03-29 MED ORDER — SULFAMETHOXAZOLE-TRIMETHOPRIM 800-160 MG PO TABS: 1.0000 | ORAL_TABLET | Freq: Two times a day (BID) | ORAL | Status: AC

## 2012-03-29 MED ORDER — OXYCODONE-ACETAMINOPHEN 5-325 MG PO TABS: 1.0000 | ORAL_TABLET | Freq: Four times a day (QID) | ORAL | Status: AC | PRN

## 2012-03-29 NOTE — Patient Instructions (Signed)
Urinary Tract Infection Infections of the urinary tract can start in several places. A bladder infection (cystitis), a kidney infection (pyelonephritis), and a prostate infection (prostatitis) are different types of urinary tract infections (UTIs). They usually get better if treated with medicines (antibiotics) that kill germs. Take all the medicine until it is gone. You or your child may feel better in a few days, but TAKE ALL MEDICINE or the infection may not respond and may become more difficult to treat. HOME CARE INSTRUCTIONS    Drink enough water and fluids to keep the urine clear or pale yellow. Cranberry juice is especially recommended, in addition to large amounts of water.   Avoid caffeine, tea, and carbonated beverages. They tend to irritate the bladder.   Alcohol may irritate the prostate.   Only take over-the-counter or prescription medicines for pain, discomfort, or fever as directed by your caregiver.  To prevent further infections:  Empty the bladder often. Avoid holding urine for long periods of time.   After a bowel movement, women should cleanse from front to back. Use each tissue only once.   Empty the bladder before and after sexual intercourse.  FINDING OUT THE RESULTS OF YOUR TEST Not all test results are available during your visit. If your or your child's test results are not back during the visit, make an appointment with your caregiver to find out the results. Do not assume everything is normal if you have not heard from your caregiver or the medical facility. It is important for you to follow up on all test results. SEEK MEDICAL CARE IF:    There is back pain.   Your baby is older than 3 months with a rectal temperature of 100.5 F (38.1 C) or higher for more than 1 day.   Your or your child's problems (symptoms) are no better in 3 days. Return sooner if you or your child is getting worse.  SEEK IMMEDIATE MEDICAL CARE IF:    There is severe back pain or lower  abdominal pain.   You or your child develops chills.   You have a fever.   Your baby is older than 3 months with a rectal temperature of 102 F (38.9 C) or higher.   Your baby is 19 months old or younger with a rectal temperature of 100.4 F (38 C) or higher.   There is nausea or vomiting.   There is continued burning or discomfort with urination.  MAKE SURE YOU:    Understand these instructions.   Will watch your condition.   Will get help right away if you are not doing well or get worse.  Document Released: 06/21/2005 Document Revised: 08/31/2011 Document Reviewed: 01/24/2007 [facility] Patient Information 2012 [place], [place].Smoking Cessation, Tips for Success YOU CAN QUIT SMOKING If you are ready to quit smoking, congratulations! You have chosen to help yourself be healthier. Cigarettes bring nicotine, tar, carbon monoxide, and other irritants into your body. Your lungs, heart, and blood vessels will be able to work better without these poisons. There are many different ways to quit smoking. Nicotine gum, nicotine patches, a nicotine inhaler, or nicotine nasal spray can help with physical craving. Hypnosis, support groups, and medicines help break the habit of smoking. Here are some tips to help you quit for good.  Throw away all cigarettes.   Clean and remove all ashtrays from your home, work, and car.   On a card, write down your reasons for quitting. Carry the card with you and read  it when you get the urge to smoke.   Cleanse your body of nicotine. Drink enough water and fluids to keep your urine clear or pale yellow. Do this after quitting to flush the nicotine from your body.   Learn to predict your moods. Do not let a bad situation be your excuse to have a cigarette. Some situations in your life might tempt you into wanting a cigarette.   Never have "just one" cigarette. It leads to wanting another and another. Remind yourself of your decision to quit.   Change habits  associated with smoking. If you smoked while driving or when feeling stressed, try other activities to replace smoking. Stand up when drinking your coffee. Brush your teeth after eating. Sit in a different chair when you read the paper. Avoid alcohol while trying to quit, and try to drink fewer caffeinated beverages. Alcohol and caffeine may urge you to smoke.   Avoid foods and drinks that can trigger a desire to smoke, such as sugary or spicy foods and alcohol.   Ask people who smoke not to smoke around you.   Have something planned to do right after eating or having a cup of coffee. Take a walk or exercise to perk you up. This will help to keep you from overeating.   Try a relaxation exercise to calm you down and decrease your stress. Remember, you may be tense and nervous for the first 2 weeks after you quit, but this will pass.   Find new activities to keep your hands busy. Play with a pen, coin, or rubber band. Doodle or draw things on paper.   Brush your teeth right after eating. This will help cut down on the craving for the taste of tobacco after meals. You can try mouthwash, too.   Use oral substitutes, such as lemon drops, carrots, a cinnamon stick, or chewing gum, in place of cigarettes. Keep them handy so they are available when you have the urge to smoke.   When you have the urge to smoke, try deep breathing.   Designate your home as a nonsmoking area.   If you are a heavy smoker, ask your caregiver about a prescription for nicotine chewing gum. It can ease your withdrawal from nicotine.   Reward yourself. Set aside the cigarette money you save and buy yourself something nice.   Look for support from others. Join a support group or smoking cessation program. Ask someone at home or at work to help you with your plan to quit smoking.   Always ask yourself, "Do I need this cigarette or is this just a reflex?" Tell yourself, "Today, I choose not to smoke," or "I do not want to  smoke." You are reminding yourself of your decision to quit, even if you do smoke a cigarette.  HOW WILL I FEEL WHEN I QUIT SMOKING?  The benefits of not smoking start within days of quitting.   You may have symptoms of withdrawal because your body is used to nicotine (the addictive substance in cigarettes). You may crave cigarettes, be irritable, feel very hungry, cough often, get headaches, or have difficulty concentrating.   The withdrawal symptoms are only temporary. They are strongest when you first quit but will go away within 10 to 14 days.   When withdrawal symptoms occur, stay in control. Think about your reasons for quitting. Remind yourself that these are signs that your body is healing and getting used to being without cigarettes.   Remember that  withdrawal symptoms are easier to treat than the major diseases that smoking can cause.   Even after the withdrawal is over, expect periodic urges to smoke. However, these cravings are generally short-lived and will go away whether you smoke or not. Do not smoke!   If you relapse and smoke again, do not lose hope. Most smokers quit 3 times before they are successful.   If you relapse, do not give up! Plan ahead and think about what you will do the next time you get the urge to smoke.  LIFE AS A NONSMOKER: MAKE IT FOR A MONTH, MAKE IT FOR LIFE Day 1: Hang this page where you will see it every day. Day 2: Get rid of all ashtrays, matches, and lighters. Day 3: Drink water. Breathe deeply between sips. Day 4: Avoid places with smoke-filled air, such as bars, clubs, or the smoking section of restaurants. Day 5: Keep track of how much money you save by not smoking. Day 6: Avoid boredom. Keep a good book with you or go to the movies. Day 7: Reward yourself! One week without smoking! Day 8: Make a dental appointment to get your teeth cleaned. Day 9: Decide how you will turn down a cigarette before it is offered to you. Day 10: Review your  reasons for quitting. Day 11: Distract yourself. Stay active to keep your mind off smoking and to relieve tension. Take a walk, exercise, read a book, do a crossword puzzle, or try a new hobby. Day 12: Exercise. Get off the bus before your stop or use stairs instead of escalators. Day 13: Call on friends for support and encouragement. Day 14: Reward yourself! Two weeks without smoking! Day 15: Practice deep breathing exercises. Day 16: Bet a friend that you can stay a nonsmoker. Day 17: Ask to sit in nonsmoking sections of restaurants. Day 18: Hang up "No Smoking" signs. Day 19: Think of yourself as a nonsmoker. Day 20: Each morning, tell yourself you will not smoke. Day 21: Reward yourself! Three weeks without smoking! Day 22: Think of smoking in negative ways. Remember how it stains your teeth, gives you bad breath, and leaves you short of breath. Day 23: Eat a nutritious breakfast. Day 24:Do not relive your days as a smoker. Day 25: Hold a pencil in your hand when talking on the telephone. Day 26: Tell all your friends you do not smoke. Day 27: Think about how much better food tastes. Day 28: Remember, one cigarette is one too many. Day 29: Take up a hobby that will keep your hands busy. Day 30: Congratulations! One month without smoking! Give yourself a big reward. Your caregiver can direct you to community resources or hospitals for support, which may include:  Group support.   Education.   Hypnosis.   Subliminal therapy.  Document Released: 06/09/2004 Document Revised: 08/31/2011 Document Reviewed: 06/28/2009 [facility] Patient Information 2012 [place], [place].

## 2012-03-29 NOTE — Progress Notes (Signed)
  Subjective:    Patient ID: Kristi Lawrence, female    DOB: March 08, 1971, 41 y.o.   MRN: [id number]  HPI 1 week of dysuria.  + hematuria.  No fever.  Some low back pain, right side.    Some nausea. No vomiting. She has not tried any over-the-counter treatments. No worsening or alleviating symptoms.  Still smoking. Says occ but not daily. Not ready to quit. Still some stress in her life with seperating from her husband.   C/o sensitive teeth for severa months. Will ache when takes a goodies powder. Does have a cracked tooth on once side but says all her teeth hurt.    Review of Systems     Objective:   Physical Exam  Constitutional: She is oriented to person, place, and time. She appears well-developed and well-nourished.  HENT:  Head: Normocephalic and atraumatic.  Cardiovascular: Normal rate, regular rhythm and normal heart sounds.   Pulmonary/Chest: Effort normal and breath sounds normal.  Abdominal: Soft. Bowel sounds are normal. She exhibits no distension and no mass. There is no tenderness. There is no rebound and no guarding.  Neurological: She is alert and oriented to person, place, and time.  Skin: Skin is warm and dry.  Psychiatric: She has a normal mood and affect. Her behavior is normal.          Assessment & Plan:  UTI- + for blood and leuks.  We'll treat for UTI. Start Bactrim twice a day x3 days. If symptoms not resolved in 3-5 days then please call the office. Make sure drinking plenty of fluids. Handout given.  tob abuse - Discussed need for smoking cessation. Given H.O.  needs pneumonia vaccine but she is having symptoms with her insurance right now and wants to hold off.  Sensitive teeth - discussed switching to a sensitive type II he also recommend she follow with her dentist to have further evaluation. She was concerned about possible heavy metal poisoning but i explained this is much less likely. If her dentist has some suspicions about this I would be happy to  test her.  Endometriosis.  Was given 10 pills a year ago by her gyn. Brought in bottle. Would like a refill for the 10 tabs.    Due for mammogram. We discussed that we can certainly schedule her downstairs for this. I will put in referral.

## 2012-04-11 ENCOUNTER — Telehealth (HOSPITAL_COMMUNITY): Payer: Self-pay

## 2012-04-11 NOTE — Telephone Encounter (Signed)
I returned the clients phone call. She indicated that there was another hearing yesterday which the clients husband was ordered to pay her $2000 to go toward medical cost. She indicated that he hasn't told middle of November 2013 to get that $2000 to her. She indicated that he was supposed to authority put her on his insurance and has not done that yet either the judge ordered him again yesterday to do that. She indicated that she could not continue to come to see me because she insurance has been billing her [employer] and he is not being paid. I told her I would check in to how much is oh, check into the possibility of financial aid for her, and give her a referral for some low cost insurance. We will have to discontinue counseling with the client in to see if there other ways that can be worked out financially.

## 2012-04-11 NOTE — Telephone Encounter (Signed)
Needs to talk to you would not say what about

## 2013-04-23 ENCOUNTER — Encounter (HOSPITAL_COMMUNITY): Payer: Self-pay | Admitting: Behavioral Health

## 2013-04-24 ENCOUNTER — Encounter (HOSPITAL_COMMUNITY): Payer: Self-pay | Admitting: Behavioral Health

## 2013-04-28 ENCOUNTER — Encounter (HOSPITAL_COMMUNITY): Payer: Self-pay | Admitting: Behavioral Health

## 2013-04-28 NOTE — Progress Notes (Unsigned)
Patient ID: Kristi Lawrence, female   DOB: 23-Jan-1971, 42 y.o.   MRN: [id number] Outpatient Therapist Discharge Summary  Kristi Lawrence    09-Dec-1970   Admission Date:    Discharge Date:  04/28/13 Reason for Discharge:  Insurance would no longer cover client's visits Diagnosis:  Axis I:  Depression 296.32/300.02  Axis II:  deferred  Axis III:  Endometriosos/OSA  Axis IV:    Axis V:    Comments:    French Ana

## 2014-05-12 ENCOUNTER — Telehealth: Payer: Self-pay | Admitting: *Deleted

## 2014-05-12 DIAGNOSIS — Z0289 Encounter for other administrative examinations: Secondary | ICD-10-CM

## 2014-05-12 NOTE — Telephone Encounter (Addendum)
Pt dropped off forms for school to be completed. I called her and informed her that the turn around time for form completion is 48-72 hours and that there would be a charge associated. Pt stated that she just dropped this form off a few weeks ago and it was completed within 1 day and there was no charge. I informed her that this is our policy unless she had brought her forms in at the time of her visit this may have possibly been waived or could have been completed same day. I told her that I would call when completed. Forms copied and scanned.Loralee PacasBarkley, Malory Sp[place]

## 2017-08-19 ENCOUNTER — Encounter: Payer: Self-pay | Admitting: Emergency Medicine

## 2017-08-19 ENCOUNTER — Other Ambulatory Visit: Payer: Self-pay

## 2017-08-19 ENCOUNTER — Emergency Department (INDEPENDENT_AMBULATORY_CARE_PROVIDER_SITE_OTHER)
Admission: EM | Admit: 2017-08-19 | Discharge: 2017-08-19 | Disposition: A | Discharge status: Other Acute Inpatient Hospital | Payer: Self-pay | Source: Home / Self Care | Attending: Family Medicine | Admitting: Family Medicine

## 2017-08-19 DIAGNOSIS — M545 Low back pain: Secondary | ICD-10-CM
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
## 2017-08-21 ENCOUNTER — Telehealth: Payer: Self-pay

## 2017-08-21 NOTE — Telephone Encounter (Signed)
Attempted to call unable to leave message.

## 2024-02-29 LAB — HM MAMMOGRAPHY

## 2024-08-18 ENCOUNTER — Telehealth: Payer: Self-pay | Admitting: Medical-Surgical

## 2024-08-18 NOTE — Telephone Encounter (Signed)
 Copied from CRM #[id number]. Topic: General - Other >> Aug 18, 2024 11:00 AM Zebedee SAUNDERS wrote: Reason for CRM: Pt wants to schedule with Zada Palin NP, pt friend of Olam Clover who is an existing pt of nurse [place]. Please call pt at [phone] to schedule appt.

## 2024-08-27 ENCOUNTER — Ambulatory Visit: Payer: Self-pay | Admitting: Medical-Surgical

## 2024-08-27 ENCOUNTER — Encounter: Payer: Self-pay | Admitting: Medical-Surgical

## 2024-08-27 VITALS — BP 169/131 | HR 85 | Resp 20 | Ht 65.0 in | Wt 174.7 lb

## 2024-08-27 DIAGNOSIS — F418 Other specified anxiety disorders: Secondary | ICD-10-CM | POA: Diagnosis not present

## 2024-08-27 DIAGNOSIS — I10 Essential (primary) hypertension: Secondary | ICD-10-CM | POA: Diagnosis not present

## 2024-08-27 DIAGNOSIS — F88 Other disorders of psychological development: Secondary | ICD-10-CM | POA: Diagnosis not present

## 2024-08-27 DIAGNOSIS — Z7689 Persons encountering health services in other specified circumstances: Secondary | ICD-10-CM | POA: Diagnosis not present

## 2024-08-27 NOTE — Patient Instructions (Addendum)
 Sertraline Lexapro Celexa  Ashwaghanda Camomile Kava St. John's wort  Holistic management of heart disease and high blood pressure:  Avoid:  Psychological stress High blood sugar Very high LDL (bad cholesterol) Estrogen Smoking  Obesity  Promote: Regular exercise Fish oil (most effective if directly from eating fish) Eating garlic Use olive oil Adequate folic acid intake Healthy weight management Control blood pressure Avoid pesticides  Supplements and vitamins that MAY be of benefit to manage heart disease and high blood pressure:    **Keep in mind that supplements, herbs, and vitamins are NOT regulated by the FDA and finding a reputable source for purchasing these is extremely important. There is very little data available to determine if these interventions are helpful or not but some patients have found them helpful. Please make sure to talk with your pharmacist or healthcare provider about possible interactions or contraindications prior to starting any of the following**  Garlic- helps reduce high cholesterol and blood pressure. Consumed raw is most effective.  Vitamin E- found in unrefined vegetable oils, whole grains, butter, organ meats, eggs, sunflower seeds, fruit, soybeans, and dark green leafy vegetables. Can also be found in most multivitamins or as a separate supplement.  Gingko Biloba- increases good cholesterol and blood flow to the heart, decreases bad cholesterol. Glutathione- neutralizes free radicals to prevent cell damage. Binds toxic substances in the liver. Make sure to have adequate intake of Vitamins C and E to help this work! Green tea- lowers bad cholesterol and triglycerides, increases good cholesterol. PCOs- found in grapeseed, lemon tree bark, peanuts, cranberries, and citrus peels. Strengthens blood vessel walls, increases circulation, and helps increase the performance of other antioxidants.  Co-enzyme Q10- decreases pain associated with angina.  Ok to use if taking a statin medication for cholesterol. Helps with periodontal disease.  Hawthorne berries- helps increase circulation and decrease blood pressure. Also helpful for angina pain.  Carnitine- most useful with angina pain and congestive heart failure (talk with your cardiologist before considering this).   Recommended vitamins to lower blood pressure:  Vitamin C 1,000-2,000mg  daily Vitamin E 400 IU daily Magnesium 500-800mg  daily Calcium up to 500mg  daily Zinc 10mg  daily Co-enzyme Q10 (CoQ10) 30-90mg  daily  Other herbs that may be beneficial  Cayenne Dandelion (diuretic) Dong Quai Ginseng

## 2024-08-27 NOTE — Progress Notes (Signed)
 New Patient Office Visit  Subjective:  Patient ID: Kristi Lawrence, female    DOB: [birth year]  Age: 53 y.o. MRN: [id number]  CC:  Chief Complaint  Patient presents with   Establish Care   HPI Kristi Lawrence presents to establish care.   Discussed the use of AI scribe software for clinical note transcription with the patient, who gave verbal consent to proceed.  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  Sensory processing dysfunction - Worsening sensory sensitivities, particularly to strong odors such as perfumes and cleaning products - Exposure to strong smells triggers a 'fight or flight' response - Crowded or noisy environments become overwhelming within 15-20 minutes, resulting in sensory overload - Sensory sensitivities significantly impact daily functioning - Feels that she is high functioning autistic although no definitive diagnosis or testing has been done  Anxiety and panic symptoms - Significant anxiety exacerbated by sensory triggers - Blood pressure spikes in response to sensory overload - History of panic attacks, especially during the COVID-19 pandemic - Currently seeking counseling for anxiety, neurodivergent symptoms, and sensory processing issues, but has difficulty finding a suitable provider who accepts Medicaid and understands her condition - Prefers female counselors  Hypertension - Multiple blood pressure medications, including beta blockers, have been trialed - Medications resulted in adverse reactions and were ineffective in controlling blood pressure spikes related to anxiety and sensory triggers - Does not monitor BP at home and does not have a cuff - Doesn't like taking prescribed medications  Reactive airway symptoms/asthma - Asthma aggravated by exposure to mold and strong odors - Reactive airway symptoms  include wheezing and sensation of throat closure when exposed to strong smells and fumes - Episodes of throat 'sticking' and difficulty breathing    Past Medical History:  Diagnosis Date   Anxiety     Past Surgical History:  Procedure Laterality Date   ABDOMINAL HYSTERECTOMY     CESAREAN SECTION  09/25/1996   toxemia   TOTAL ABDOMINAL HYSTERECTOMY     TUBAL LIGATION  09/26/2003    Family History  Problem Relation Age of Onset   Cancer Paternal Grandmother        cervical?    Heart attack Paternal Uncle    Depression Brother    Alzheimer's disease Paternal Aunt    Schizophrenia Maternal Grandmother     Social History   Socioeconomic History   Marital status: Married    Spouse name: GINIA RUDELL   Number of children: 1   Years of education: assoc degr   Highest education level: Not on file  Occupational History   Occupation: Writer: Self Employed  Tobacco Use   Smoking status: Some Days    Types: Cigarettes   Smokeless tobacco: Never  Substance and Sexual Activity   Alcohol use: Yes    Comment: Client reports one to three alcoholic beverages monthly   Drug use: No   Sexual activity: Yes    Partners: Male    Birth control/protection: Other-see comments  Other Topics Concern   Not on file  Social History Narrative   No regular exercise.  No caffeine   Poor calcium intake.    Seperated from her husband.     Social Drivers of Corporate Investment Banker Strain: Not on file  Food Insecurity: Low Risk  (05/05/2024)  Received from [facility]   Hunger Vital Sign    Within the past 12 months, you worried that your food would run out before you got money to buy more: Never true    Within the past 12 months, the food you bought just didn't last and you didn't have money to get more. : Never true  Transportation Needs: No Transportation Needs (05/05/2024)   Received from [facility]    In the past 12 months, has lack of reliable  transportation kept you from medical appointments, meetings, work or from getting things needed for daily living? : No  Physical Activity: Not on file  Stress: Not on file  Social Connections: Unknown (01/27/2022)   Received from [facility]   Social Network    Social Network: Not on file  Intimate Partner Violence: Unknown (12/27/2021)   Received from [facility]   HITS    Physically Hurt: Not on file    Insult or Talk Down To: Not on file    Threaten Physical Harm: Not on file    Scream or Curse: Not on file    ROS Review of Systems  Eyes:  Positive for visual disturbance.  Respiratory:  Positive for cough and wheezing.   Gastrointestinal:  Positive for nausea.  Neurological:  Positive for headaches.  Psychiatric/Behavioral:  Positive for decreased concentration. The patient is nervous/anxious.     Objective:   Today's Vitals: BP (!) 169/131   Pulse 85   Resp 20   Ht 5' 5 (1.651 m)   Wt 174 lb 11.2 oz (79.2 kg)   LMP 08/07/2017 (Exact Date)   SpO2 98%   Breastfeeding No   BMI 29.07 kg/m   Physical Exam Vitals reviewed.  Constitutional:      General: She is not in acute distress.    Appearance: Normal appearance. She is not ill-appearing.  HENT:     Head: Normocephalic and atraumatic.  Cardiovascular:     Rate and Rhythm: Normal rate and regular rhythm.     Pulses: Normal pulses.     Heart sounds: Normal heart sounds. No murmur heard.    No friction rub. No gallop.  Pulmonary:     Effort: Pulmonary effort is normal. No respiratory distress.     Breath sounds: Normal breath sounds. No wheezing.  Skin:    General: Skin is warm and dry.  Neurological:     Mental Status: She is alert and oriented to person, place, and time.  Psychiatric:        Mood and Affect: Mood normal.        Behavior: Behavior normal.        Thought Content: Thought content normal.        Judgment: Judgment normal.     Assessment & Plan:   1. Encounter to establish care with new  provider (Primary) Reviewed available information and discussed care concerns with patient.   2. Primary hypertension BP significantly elevated on arrival, worse on recheck. Currently taking vitamins but no prescribed medications. Hesitant to consider blood pressure medications because of previous intolerances. Patient feels certain that her elevated readings are related to sensory processing issues, anxiety, and possibly white coat hypertension. Recommend getting a home BP cuff that measures on the arm and checking her BP several times weekly to allow for ambulatory evaluation. Goal is less than 130/80.   3. Depression with anxiety/Sensory processing difficulty Has a plan in place for testing for a formal diagnosis of  ASD and possible ADHD. Discussed options for treatment of anxiety triggered by the sensory processing issues. Her brother had a bad response to fluoxetine so she wants to avoid that. A list of prescription and herbal options was provided with her AVS so she can research and determine if she wants to try something. For now, referring to behavioral health for counseling.  - Ambulatory referral to Behavioral Health  Outpatient Encounter Medications as of 08/27/2024  Medication Sig   ACETYLCYSTEINE PO Take by mouth.   Ascorbic Acid (VITAMIN C PO) Take by mouth.   Gamma-Aminobutyric Acid (GABA PO) Take by mouth.   MAGNESIUM PO Take by mouth.   Methylsulfonylmethane (MSM PO) Take by mouth.   [DISCONTINUED] naproxen  (NAPROSYN ) 500 MG tablet Take 500 mg by mouth 2 (two) times daily with a meal.   No facility-administered encounter medications on file as of 08/27/2024.   Follow-up: Return in about 6 weeks (around 10/08/2024) for chronic disease follow up.   Zada FREDRIK Palin, DNP, APRN, FNP-BC [facility]

## 2024-08-28 ENCOUNTER — Telehealth: Payer: Self-pay

## 2024-08-28 MED ORDER — SERTRALINE HCL 50 MG PO TABS: ORAL_TABLET | ORAL | 0 refills | Status: DC

## 2024-08-28 NOTE — Telephone Encounter (Signed)
 Spoke with the pt and advised the medication has been sent in today. Pt is scheduled to come in 10/13/24 for f/u

## 2024-08-28 NOTE — Telephone Encounter (Signed)
 Copied from CRM #[id number]. Topic: Clinical - Medication Question >> Aug 28, 2024 10:57 AM Delon DASEN wrote: Reason for CRM: Patient is requesting for Joy to go ahead and prescribe the anxiety medication Sertraline. Just lost her job and need it. Please call in to [facility] in [place]. [phone]

## 2024-08-28 NOTE — Telephone Encounter (Signed)
 Meds ordered this encounter  Medications   sertraline (ZOLOFT) 50 MG tablet    Sig: Take 1/2 tablet (25mg ) daily for 7 days then increase to 1 full tablet (50mg ) daily.    Dispense:  90 tablet    Refill:  0    Supervising Provider:   ALVIA, CODY [[id number]]   Please contact patient to let her know that this has been sent in as requested. She will need to schedule a follow up with me in 4-6 weeks to evaluate tolerance and effectiveness of the medication.  ___________________________________________ Zada FREDRIK Palin, DNP, APRN, FNP-BC [facility]

## 2024-09-22 ENCOUNTER — Ambulatory Visit: Payer: Self-pay

## 2024-09-22 NOTE — Telephone Encounter (Signed)
 Patient shows scheduled for 09/29/2024 - is this too long for patient to wait to be seen for rash?

## 2024-09-22 NOTE — Telephone Encounter (Signed)
 FYI Only or Action Required?: FYI only for provider: appointment scheduled on 09/30/23.  Patient was last seen in primary care on 08/27/2024 by Willo Mini, NP.  Called Nurse Triage reporting Rash.  Symptoms began a week ago.  Interventions attempted: OTC medications: anti-itch cream and Rest, hydration, or home remedies.  Symptoms are: unchanged.  Triage Disposition: Home Care  Patient/caregiver understands and will follow disposition?: Yes    Copied from CRM #[id number]. Topic: Clinical - Red Word Triage >> Sep 22, 2024  1:26 PM Mercer PEDLAR wrote: Red Word that prompted transfer to Nurse Triage: Difficulty exhaling through nose during sleep. Patient also stated that she may be having side effects to sertraline  (ZOLOFT ) 50 MG tablet. She is having redness and itching on the inside of elbows and roof of mouth.   Reason for Disposition  Mild localized rash  Answer Assessment - Initial Assessment Questions Pt called in to discuss sensory issues, stating that she gets mast cell rxns and has had a red, itchy rash on her inner forearms x 1 wk. Pt unsure if r/t Zoloft  or contact with other allergen. Pt denies any oral or facial swelling. No h/a, fever or other medication rxn symptom. Pt states itching has improved since taking Mg, applying anti-itch cream and lotion. Pt reports itching is worse in am and HS. Pt also wanted to discuss with PCP, dx of OSA. Pt reports using Cpap several years ago but stopped d/t machine getting mold in it. States that she had a repeat sleep study a year ago and was told she did not have OSA but pt reports she still feels like she sleeps really hard then wakes up unable to breath out. Pt reports having anxiety but that breathing has been an issue for several years. Discussed possible referral for further evaluation with pulmonology. Appointment scheduled for evaluation. Patient agrees with plan of care, and will call back if anything changes, or if symptoms worsen.        1. APPEARANCE of RASH: What does the rash look like? (e.g., blisters, dry flaky skin, red spots, redness, sores)     Red itchy skin   2. LOCATION: Where is the rash located?      Bilateral arms   3. NUMBER: How many spots are there?      Red  4. SIZE: How big are the spots? (e.g., inches, cm; or compare to size of pinhead, tip of pen, eraser, pea)      Red itchy patches on the inner forearms   5. ONSET: When did the rash start?      Within the last week   6. ITCHING: Does the rash itch? If Yes, ask: How bad is the itch?  (Scale 0-10; or none, mild, moderate, severe)     Yes; very itchy   7. PAIN: Does the rash hurt? If Yes, ask: How bad is the pain?  (Scale 0-10; or none, mild, moderate, severe)     No   8. OTHER SYMPTOMS: Do you have any other symptoms? (e.g., fever)     No  Protocols used: Rash or Redness - Localized-A-AH

## 2024-09-22 NOTE — Telephone Encounter (Signed)
 Spoke with patient. She only wants to see Zada Palin, NP -she feels she is the only one who understands her current issues. She will keep the January 5th appt but if symptoms worsen she will call to let us  know and if symptoms become concerning will  go to urgent care  or ER for treatment.

## 2024-09-29 ENCOUNTER — Ambulatory Visit: Admitting: Medical-Surgical

## 2024-09-29 ENCOUNTER — Encounter: Payer: Self-pay | Admitting: Medical-Surgical

## 2024-09-29 VITALS — BP 172/123 | HR 71 | Temp 98.6°F | Resp 20 | Ht 65.0 in | Wt 171.0 lb

## 2024-09-29 DIAGNOSIS — I10 Essential (primary) hypertension: Secondary | ICD-10-CM | POA: Diagnosis not present

## 2024-09-29 DIAGNOSIS — R21 Rash and other nonspecific skin eruption: Secondary | ICD-10-CM | POA: Diagnosis not present

## 2024-09-29 DIAGNOSIS — F418 Other specified anxiety disorders: Secondary | ICD-10-CM

## 2024-09-29 DIAGNOSIS — G4733 Obstructive sleep apnea (adult) (pediatric): Secondary | ICD-10-CM | POA: Diagnosis not present

## 2024-09-29 MED ORDER — TRIAMCINOLONE ACETONIDE 0.1 % EX CREA: 1.0000 | TOPICAL_CREAM | Freq: Two times a day (BID) | CUTANEOUS | 1 refills | Status: AC

## 2024-09-29 NOTE — Progress Notes (Signed)
 "       Established patient visit  History of Present Illness   Discussed the use of AI scribe software for clinical note transcription with the patient, who gave verbal consent to proceed.  History of Present Illness   Kristi Lawrence is a 54 year old female who presents with a persistent itchy rash on her forearms.  Pruritic rash - Persistent itchy rash on both forearms, initially started as a small lesion and spread - Rash remains pruritic but the erythema is improving - Aveeno lotion alleviates dryness but does not relieve itch - Ice provides temporary relief, especially at night - Concern for possible medication reaction due to family history of similar rash with medications  Anxiety and psychological stress - Significant stress related to financial instability and loss of long-term mentor/employer - Increased anxiety and feelings of isolation - Recently started sertraline  for anxiety; missed one dose yesterday - Feels that the sertraline  is helping - No effect of sertraline  on blood pressure  Sleep disturbance and nocturnal dyspnea - History of sleep apnea diagnosed approximately 20 years ago; previously used CPAP - Recent sleep study reportedly showed no sleep apnea - Continues to experience nocturnal awakening with panic and shortness of breath - Breathes easier when sleeping upright - Feels like there is something along the left side of her neck that cuts off her breathing at night but has happened during the day before  Palpitations - Experienced episode of irregular heartbeats on New Years Eve - Described as pauses in heartbeat lasting intermittently over 30 to 60 minutes - No recurrence since initial episode   Physical Exam   Physical Exam Vitals reviewed.  Constitutional:      General: She is not in acute distress.    Appearance: Normal appearance.  HENT:     Head: Normocephalic and atraumatic.  Cardiovascular:     Rate and Rhythm: Normal rate and regular  rhythm.     Pulses: Normal pulses.     Heart sounds: Normal heart sounds. No murmur heard.    No friction rub. No gallop.  Pulmonary:     Effort: Pulmonary effort is normal. No respiratory distress.     Breath sounds: Normal breath sounds. No wheezing.  Skin:    General: Skin is warm and dry.     Findings: No rash.  Neurological:     Mental Status: She is alert and oriented to person, place, and time.  Psychiatric:        Mood and Affect: Mood normal.        Behavior: Behavior normal.        Thought Content: Thought content normal.        Judgment: Judgment normal.    Assessment & Plan     Rash Resolved but with persistent itch. Consider contact dermatitis, neurodermatitis, eczema, or possible medication-induced reaction. - Prescribed triamcinolone  ointment twice daily for up to two weeks, followed by at least a two-week break. - Only use triamcinolone  cream if rash recurs.  Primary hypertension Blood pressure remains elevated. Previously treated with antihypertensives but very reluctant to consider getting restarted on a medication.  - Rechecked blood pressure during visit- 172/123. - Counseled patient on the danger of untreated hypertension, especially with her readings being so high.  - Strongly recommended restarting medication- patient declined.   Depression with anxiety On sertraline  50mg  daily for four weeks. Some improvement in anxiety, no change in blood pressure. Significant life stressors present. - Continue current dose of sertraline . -  Follow up in four weeks to assess effectiveness and adjust dosage if necessary.  Obstructive sleep apnea Previous sleep study negative for sleep apnea. Nighttime breathing difficulty may be structural. - Referred to ENT specialist for evaluation of potential structural issues affecting breathing.     Follow up   Return in about 4 weeks (around 10/27/2024) for mood/HTN follow up. __________________________________ Zada FREDRIK Palin,  DNP, APRN, FNP-BC Primary Care and Sports Medicine [facility] [place] "

## 2024-10-13 ENCOUNTER — Ambulatory Visit: Admitting: Medical-Surgical

## 2024-10-14 ENCOUNTER — Ambulatory Visit (HOSPITAL_COMMUNITY): Admitting: Licensed Clinical Social Worker

## 2024-10-20 ENCOUNTER — Ambulatory Visit (HOSPITAL_COMMUNITY): Admitting: Licensed Clinical Social Worker

## 2024-10-28 ENCOUNTER — Encounter: Payer: Self-pay | Admitting: Medical-Surgical

## 2024-10-28 ENCOUNTER — Ambulatory Visit: Admitting: Medical-Surgical

## 2024-10-28 VITALS — BP 192/125 | HR 76 | Resp 20 | Ht 65.0 in | Wt 173.0 lb

## 2024-10-28 DIAGNOSIS — Z560 Unemployment, unspecified: Secondary | ICD-10-CM | POA: Diagnosis not present

## 2024-10-28 DIAGNOSIS — F418 Other specified anxiety disorders: Secondary | ICD-10-CM

## 2024-10-28 DIAGNOSIS — I10 Essential (primary) hypertension: Secondary | ICD-10-CM

## 2024-10-28 MED ORDER — SERTRALINE HCL 50 MG PO TABS: 50.0000 mg | ORAL_TABLET | Freq: Two times a day (BID) | ORAL | 1 refills | Status: DC

## 2024-10-28 NOTE — Progress Notes (Signed)
 "       Established patient visit   History of Present Illness   Discussed the use of AI scribe software for clinical note transcription with the patient, who gave verbal consent to proceed.  History of Present Illness   Kristi Lawrence is a 54 year old female who presents with episodes of severe nausea, dizziness, and weakness.  Episodic nausea, dizziness, and weakness - Recurrent episodes of severe nausea, dizziness, and weakness, sometimes accompanied by vomiting - Recent episode included vomiting of medication, avoidance of food and fluids, and severe headache with head pain - Symptoms can be triggered by exposure to strong perfumes and cleaning products - Recent strong scent exposure caused dizziness and nausea for both her and her brother - Severe headache and head pain occurred during the most recent episode of nausea and vomiting  Medication tolerance and adherence - Vomited medication dose on Saturday during an episode - Possible missed dose on Thursday or Friday - Finds medication helpful when able to take it  Anxiety and panic attacks - Taking sertraline  50mg  daily, tolerating well, feels it's helpful - After vomiting the medication, she took two doses the next day - Recent episode perceived as different from prior panic attacks - Significant anxiety related to financial stress and unemployment  Hypertension - Intermittent elevated blood pressure at home during periods of stress - Usual blood pressure readings in the 120s to 130s but readings are extremely labile when stressed/anxiety or when exposed to environmental agents   Physical Exam   Physical Exam Vitals reviewed.  Constitutional:      General: She is not in acute distress.    Appearance: Normal appearance.  HENT:     Head: Normocephalic and atraumatic.  Cardiovascular:     Rate and Rhythm: Normal rate and regular rhythm.     Pulses: Normal pulses.     Heart sounds: Normal heart sounds. No murmur heard.     No friction rub. No gallop.  Pulmonary:     Effort: Pulmonary effort is normal. No respiratory distress.     Breath sounds: Normal breath sounds. No wheezing.  Skin:    General: Skin is warm and dry.  Neurological:     Mental Status: She is alert and oriented to person, place, and time.  Psychiatric:        Mood and Affect: Mood normal.        Behavior: Behavior normal.        Thought Content: Thought content normal.        Judgment: Judgment normal.    Assessment & Plan   Depression with anxiety Situational anxiety exacerbated by financial stress and job search challenges. Nausea, dizziness, and vomiting possibly related to stress. Prefers current sertraline  regimen with flexibility for additional doses. - Prescribed sertraline  50 mg daily with option for up to twice daily dosing if needed. - Referred to care management team for support with social determinants of health.  Primary hypertension Blood pressure generally within acceptable range but elevated with stress. Current management with lifestyle modifications and stress management is not adequate. - Continue home blood pressure monitoring. - Aim for readings below 130/80 mmHg. - If consistently higher than goal, return for medication management.  - BP mildly elevated on arrival, recheck showing severe elevation secondary to anxiety and stress after discussing her concerns with finances and unemployment.  General health maintenance Mammogram up to date. Declined colonoscopy, pneumonia vaccine, and flu vaccine despite discussion of importance. - Documented refusal  of colonoscopy, pneumonia vaccine, and flu vaccine in medical record.     Follow up   Return in about 3 months (around 01/25/2025) for BP/mood follow up. __________________________________ Zada FREDRIK Palin, DNP, APRN, FNP-BC Primary Care and Sports Medicine [facility] [place] "

## 2024-10-29 ENCOUNTER — Telehealth: Payer: Self-pay

## 2024-10-29 NOTE — Progress Notes (Signed)
 Complex Care Management Note  Care Guide Note 10/29/2024 Name: Kristi Lawrence MRN: [id number] DOB: 1970/12/09  Kristi Lawrence is a 54 y.o. year old female who sees Willo Mini, NP for primary care. I reached out to Kristi Lawrence by phone today to offer complex care management services.  [name] was given information about Complex Care Management services today including:   The Complex Care Management services include support from the care team which includes your Nurse Care Manager, Clinical Social Worker, or Pharmacist.  The Complex Care Management team is here to help remove barriers to the health concerns and goals most important to you. Complex Care Management services are voluntary, and the patient may decline or stop services at any time by request to their care team member.   Complex Care Management Consent Status: Patient agreed to services and verbal consent obtained.   Follow up plan:  Telephone appointment with complex care management team member scheduled for:  11/04/24 and 11/05/24  Encounter Outcome:  Patient Scheduled  Doyce Razor [facility], [facility] Guide Direct Dial: [phone]  Fax: [phone]

## 2024-10-31 ENCOUNTER — Other Ambulatory Visit: Payer: Self-pay

## 2024-10-31 MED ORDER — SERTRALINE HCL 50 MG PO TABS: 50.0000 mg | ORAL_TABLET | Freq: Two times a day (BID) | ORAL | 1 refills | Status: AC

## 2024-11-03 ENCOUNTER — Institutional Professional Consult (permissible substitution) (INDEPENDENT_AMBULATORY_CARE_PROVIDER_SITE_OTHER)

## 2024-11-04 ENCOUNTER — Telehealth

## 2024-11-05 ENCOUNTER — Telehealth

## 2024-11-07 ENCOUNTER — Telehealth

## 2024-11-18 ENCOUNTER — Ambulatory Visit (HOSPITAL_COMMUNITY): Admitting: Licensed Clinical Social Worker

## 2025-01-27 ENCOUNTER — Ambulatory Visit: Admitting: Medical-Surgical
